# Patient Record
Sex: Female | Born: 1970 | Race: Asian | Hispanic: No | Marital: Married | Smoking: Never smoker
Health system: Southern US, Community
[De-identification: ages and names within clinical notes are randomized; demographics above are authoritative.]

## PROBLEM LIST (undated history)

## (undated) DIAGNOSIS — R569 Unspecified convulsions: Secondary | ICD-10-CM

---

## 1999-10-15 ENCOUNTER — Other Ambulatory Visit: Admission: RE | Admit: 1999-10-15 | Discharge: 1999-10-15 | Payer: Self-pay | Admitting: Obstetrics and Gynecology

## 2000-05-11 ENCOUNTER — Inpatient Hospital Stay (HOSPITAL_COMMUNITY): Admission: AD | Admit: 2000-05-11 | Discharge: 2000-05-14 | Payer: Self-pay | Admitting: Obstetrics and Gynecology

## 2000-05-12 ENCOUNTER — Encounter: Payer: Self-pay | Admitting: Obstetrics and Gynecology

## 2000-06-04 ENCOUNTER — Inpatient Hospital Stay (HOSPITAL_COMMUNITY): Admission: AD | Admit: 2000-06-04 | Discharge: 2000-06-06 | Payer: Self-pay | Admitting: *Deleted

## 2000-07-23 ENCOUNTER — Other Ambulatory Visit: Admission: RE | Admit: 2000-07-23 | Discharge: 2000-07-23 | Payer: Self-pay | Admitting: Obstetrics and Gynecology

## 2002-04-22 ENCOUNTER — Other Ambulatory Visit: Admission: RE | Admit: 2002-04-22 | Discharge: 2002-04-22 | Payer: Self-pay | Admitting: Obstetrics and Gynecology

## 2004-05-07 ENCOUNTER — Emergency Department (HOSPITAL_COMMUNITY): Admission: EM | Admit: 2004-05-07 | Discharge: 2004-05-07 | Payer: Self-pay

## 2010-09-20 ENCOUNTER — Emergency Department (HOSPITAL_COMMUNITY): Payer: BC Managed Care – PPO

## 2010-09-20 ENCOUNTER — Encounter (HOSPITAL_COMMUNITY): Payer: Self-pay | Admitting: Radiology

## 2010-09-20 ENCOUNTER — Observation Stay (HOSPITAL_COMMUNITY)
Admission: EM | Admit: 2010-09-20 | Discharge: 2010-09-21 | Disposition: A | Discharge status: Home / Self Care | Payer: BC Managed Care – PPO | Attending: Internal Medicine | Admitting: Internal Medicine

## 2010-09-20 DIAGNOSIS — E876 Hypokalemia: Secondary | ICD-10-CM | POA: Insufficient documentation

## 2010-09-20 DIAGNOSIS — IMO0002 Reserved for concepts with insufficient information to code with codable children: Secondary | ICD-10-CM | POA: Insufficient documentation

## 2010-09-20 DIAGNOSIS — W19XXXA Unspecified fall, initial encounter: Secondary | ICD-10-CM | POA: Insufficient documentation

## 2010-09-20 DIAGNOSIS — Y9289 Other specified places as the place of occurrence of the external cause: Secondary | ICD-10-CM | POA: Insufficient documentation

## 2010-09-20 DIAGNOSIS — G40909 Epilepsy, unspecified, not intractable, without status epilepticus: Principal | ICD-10-CM | POA: Insufficient documentation

## 2010-09-20 DIAGNOSIS — D72829 Elevated white blood cell count, unspecified: Secondary | ICD-10-CM | POA: Insufficient documentation

## 2010-09-20 DIAGNOSIS — R42 Dizziness and giddiness: Secondary | ICD-10-CM | POA: Insufficient documentation

## 2010-09-20 DIAGNOSIS — R000 Tachycardia, unspecified: Secondary | ICD-10-CM | POA: Insufficient documentation

## 2010-09-20 HISTORY — DX: Unspecified convulsions: R56.9

## 2010-09-20 LAB — URINALYSIS, ROUTINE W REFLEX MICROSCOPIC
Ketones, ur: NEGATIVE mg/dL
Leukocytes, UA: NEGATIVE
Nitrite: NEGATIVE
Protein, ur: NEGATIVE mg/dL
Urine Glucose, Fasting: NEGATIVE mg/dL
Urobilinogen, UA: 0.2 mg/dL (ref 0.0–1.0)

## 2010-09-20 LAB — POCT CARDIAC MARKERS
CKMB, poc: <1 ng/mL — ABNORMAL LOW (ref 1.0–8.0)
Myoglobin, poc: 243 ng/mL (ref 12–200)
Troponin i, poc: <0.05 ng/mL (ref 0.00–0.09)

## 2010-09-20 LAB — CARDIAC PANEL(CRET KIN+CKTOT+MB+TROPI)
Total CK: 571 U/L — ABNORMAL HIGH (ref 7–177)
Troponin I: 0.04 ng/mL (ref 0.00–0.06)

## 2010-09-20 LAB — POCT I-STAT, CHEM 8
BUN: 9 mg/dL (ref 6–23)
Calcium, Ion: 1.14 mmol/L (ref 1.12–1.32)
Chloride: 110 mEq/L (ref 96–112)
Creatinine, Ser: 0.5 mg/dL (ref 0.4–1.2)
Glucose, Bld: 124 mg/dL — ABNORMAL HIGH (ref 70–99)
HCT: 48 % — ABNORMAL HIGH (ref 36.0–46.0)
Hemoglobin: 16.3 g/dL — ABNORMAL HIGH (ref 12.0–15.0)
Potassium: 3.4 mEq/L — ABNORMAL LOW (ref 3.5–5.1)
Sodium: 142 mEq/L (ref 135–145)
TCO2: 11 mmol/L (ref 0–100)

## 2010-09-20 LAB — DIFFERENTIAL
Basophils Absolute: 0 10*3/uL (ref 0.0–0.1)
Eosinophils Absolute: 0.1 10*3/uL (ref 0.0–0.7)
Lymphocytes Relative: 20 % (ref 12–46)
Lymphs Abs: 3.2 10*3/uL (ref 0.7–4.0)
Neutrophils Relative %: 75 % (ref 43–77)

## 2010-09-20 LAB — RAPID URINE DRUG SCREEN, HOSP PERFORMED
Amphetamines: NOT DETECTED
Benzodiazepines: NOT DETECTED
Cocaine: NOT DETECTED
Tetrahydrocannabinol: NOT DETECTED

## 2010-09-20 LAB — URINE MICROSCOPIC-ADD ON

## 2010-09-20 LAB — POCT PREGNANCY, URINE: Preg Test, Ur: NEGATIVE

## 2010-09-20 LAB — CBC
MCV: 90.9 fL (ref 78.0–100.0)
Platelets: 269 10*3/uL (ref 150–400)
RBC: 4.73 MIL/uL (ref 3.87–5.11)
WBC: 16.5 10*3/uL — ABNORMAL HIGH (ref 4.0–10.5)

## 2010-09-20 MED ORDER — GADOBENATE DIMEGLUMINE 529 MG/ML IV SOLN
15.0000 mL | Freq: Once | INTRAVENOUS | Status: AC
  Administered 2010-09-20: 15 mL via INTRAVENOUS

## 2010-09-21 ENCOUNTER — Inpatient Hospital Stay (HOSPITAL_COMMUNITY): Payer: BC Managed Care – PPO

## 2010-09-21 LAB — CBC
HCT: 35 % — ABNORMAL LOW (ref 36.0–46.0)
MCH: 28.2 pg (ref 26.0–34.0)
MCHC: 32.3 g/dL (ref 30.0–36.0)
MCV: 87.3 fL (ref 78.0–100.0)
Platelets: 215 10*3/uL (ref 150–400)
RDW: 12.8 % (ref 11.5–15.5)

## 2010-09-21 LAB — GLUCOSE, CAPILLARY: Glucose-Capillary: 121 mg/dL — ABNORMAL HIGH (ref 70–99)

## 2010-09-21 LAB — BASIC METABOLIC PANEL
BUN: 7 mg/dL (ref 6–23)
Creatinine, Ser: 0.69 mg/dL (ref 0.4–1.2)
GFR calc non Af Amer: >60 mL/min (ref >60)
Glucose, Bld: 99 mg/dL (ref 70–99)

## 2010-10-18 NOTE — H&P (Signed)
NAME:  Lori [name], Lori [name]               ACCOUNT NO.:  [id number]  MEDICAL RECORD NO.:  [id number]           PATIENT TYPE:  I  LOCATION:  3032                         FACILITY:  [facility]  PHYSICIAN:  Kela Millin, M.D.DATE OF BIRTH:  19-Jul-1971  DATE OF ADMISSION:  09/20/2010 DATE OF DISCHARGE:                             HISTORY & PHYSICAL   PRIMARY CARE PHYSICIAN:  The patient is unable.  CHIEF COMPLAINT:  Seizures.  HISTORY OF PRESENT ILLNESS:  The patient is a 40 year old female with past medical history significant for seizure x1 in 2005, for which, according to her husband, she was evaluated in the hospital but never started on anticonvulsants (per ED records, the patient was supposed to follow up with Neurology), and she presents today with the above complaints.  The history is obtained from the ED records as well as the patient's husband and some contribution from the patient.  The patient is an employee of the [employer] and it is reported that she was at work today and per her colleague complained of some dizziness and a few seconds later, she collapsed/fell to the floor, hitting her head and then had a seizure-like activity/convulsion.  Her husband states that he was told that the patient had two of this episode of witnessed seizures at work today.  It is reported that following these episodes, the patient was agitated and confused.  At this time, the patient is unable to tell me whether she was incontinent of stool or urine, and her husband also states that she does not know as she was not present at the time.  She denies chest pain, palpitations, headaches, fevers, dysuria, diarrhea, melena, and no hematochezia.  Her husband states that before today the only seizure that she had had in the past is the one in 2005, but he states that it seemed to happen when patient is tired and has not had enough sleep. She was seen in the ED and a CT scan of her brain was  done, and it showed no evidence of intracranial abnormality, mild left forehead soft tissue swelling without fracture.  A urinalysis was done and came back negative for infection and her white cell count was noted to be 16.5, her alcohol level less than 5, and a chest x-ray showed no acute cardiopulmonary process.  Neurology was consulted to see the patient and Dr. Vickey Huger saw the patient, and she was loaded with 1 g of IV Keppra and started on Keppra 500 q.12 h.  Admission to the hospitalist service was recommended for further evaluation and management.  A urine drug screen was also done in the ED, and it came back negative. At the time of my exam, the patient is alert, oriented x1 with some confusion and has difficulty recalling the events.  PAST MEDICAL HISTORY:  As above.  MEDICATIONS:  Multivitamins daily.  ALLERGIES:  No known drug allergies  SOCIAL HISTORY:  She denies tobacco, also denies alcohol and no illicit drug use.  FAMILY HISTORY:  Per her husband, no family history of seizures or cancer.  REVIEW OF SYSTEMS:  As per HPI, otherwise  unremarkable.  PHYSICAL EXAMINATION:  GENERAL:  The patient is a young female, in no apparent distress.  She is alert, oriented x1. VITAL SIGNS:  Her blood pressure 100/67; temperature 98.3; pulse 87, initially 141 in the ED; respiratory rate 18, initially 28 in the ED; O2 sat 100%. HEENT:  PERRL, EOMI, sclerae anicteric, slightly dry mucous membranes. NECK:  Supple, no adenopathy, no thyromegaly, and no JVD. LUNGS:  Clear to auscultation bilaterally.  No crackles or wheezes. CARDIOVASCULAR:  Regular rate and rhythm.  Normal S1 and S2. ABDOMEN:  Soft, bowel sounds present, nontender, nondistended.  No organomegaly and no masses palpable. EXTREMITIES:  No cyanosis and no edema. NEUROLOGIC:  She is alert and oriented x1.  Cranial nerves II through XII grossly intact.  Her strength is 4-5/5 and symmetric.  DTRs are +2 and symmetric.  He  toes are downgoing bilaterally.  No pronator drift.  LABORATORY DATA:  CT scan and chest x-ray as per HPI.  Urinalysis negative for infection. White cell count is 16.5 with a hemoglobin of 13.7, hematocrit of 43, platelet count of 269.  Alcohol level is less than 5 and her sodium is 142 with a potassium of 3.4, chloride is 110, BUN 9, creatinine is 0.5, glucose is 124, ionized calcium is 1.14. The urine drug screen is negative.  ASSESSMENT AND PLAN: 1. Seizures - witnessed by coworkers.  In hospital, Neurology     consulted and as already discussed above, the patient was started     on Keppra 1 g IV load and 500 q.12 h.  An EEG as well as an MRI/MRA     have been ordered.  Given reports of dizziness prior to the fall     and seizure, I will obtain cardiac enzymes as well as a 2-D     echocardiogram. 2. Tachycardia - upon arrival in the ED, she was noted to have a pulse     of 141 and EKG showed normal sinus tachycardia with a heart rate of     131.  It is noted that the patient was started on IV fluids in the     ED and with that, her tachycardia resolved and on recheck, her     pulse was 87, the patient's blood pressure also reached a low of     95/61 in the ED.  The likely etiology is volume depletion, cardiac     enzymes obtained as above as well, the patient is chest pain free.     We will continue hydration and follow at this time. 3. Leukocytosis - urinalysis and chest x-ray are negative for     infection, we will follow and recheck CBC in a.m. - possibly     secondary to stress demargination. 4. Hypokalemia - replace potassium.     Kela Millin, M.D.     ACV/MEDQ  D:  09/20/2010  T:  09/21/2010  Job:  [id number]  Electronically Signed by Donnalee Curry M.D. on 10/16/2010 07:32:31 PM

## 2010-11-08 NOTE — Consult Note (Signed)
Lori [name], Lori [name]               ACCOUNT NO.:  [id number]  MEDICAL RECORD NO.:  [id number]           PATIENT TYPE:  I  LOCATION:  3032                         FACILITY:  [facility]  PHYSICIAN:  Lori [name], M.D.  DATE OF BIRTH:  August 04, 1971  DATE OF CONSULTATION:  09/20/2010 DATE OF DISCHARGE:                                CONSULTATION   TIME:  1 p.m.  REASON FOR CONSULTATION:  Seizure.  HISTORY OF PRESENT ILLNESSES:  This is a 40 year old female with no past medical history who presented today with 3+ seizures.  Per the patient's husband who is at bedside over the last few months, the patient has been extremely tired and intermittently having episodes and which were very syncopal/seizure like.  Most often, the patient would be standing up when these occurred, suddenly dropped to the floor without any warning with mild jerking.  Most of these events only lasted for a few minutes and had a very short postictal period of 5-10 minutes.  As stated above, the patient's family notices this only occurs when she is extremely tired.  The patient awoke this morning at baseline, went to work at 6:30 a.m.  The patient works as a Forensic psychologist at [employer]. While the patient was at work, she did not feel well and had a sudden episode in which she blacked out, hit the floor, and had notable tonic- clonic activity.  This activity only lasted for approximately 1-2 minutes with a 5-minute postictal period.  The patient was brought to the emergency department where she had a CT of her head which was negative for any mass or bleed.  While in the CT scanner, the patient had a second episode of 1-2 minute seizure activity with again a quick postictal period.  During that time, the patient did have incontinence and did bite the vermilion border of her tongue.  While the patient continued to stay in the emergency room, the patient was able to get up and walked to the bathroom.   Apparently, she requested that she be left alone in the bathroom for privacy.  At that time, the nurse screamed and found the patient on the floor again with tonic-clonic activity.  Again, these activities only lasted for a brief period of time.  The patient's postictal period on the third episode was approximately 10 minutes.  At present time, the patient is lying supine in her bed with no tonic- clonic activity.  She is alert and oriented.  She does have significant bruising and lacerations to the frontal aspect and left periorbital region of her eye.  MEDICATIONS:  None.  ALLERGIES:  None.  FAMILY HISTORY:  None.  SOCIAL HISTORY:  The patient does not drink, smoke, or do illicit drugs. Lives with her husband, has 2 children.  Gets about 6 hours of sleep at night, however, in the last few nights, has received less than that.  REVIEW OF SYSTEMS:  Generalized weakness and tired.  Otherwise, negative.  PHYSICAL EXAMINATION:  VITAL SIGNS:  Blood pressure is 122/61, pulse 102- 141, respirations 28, temperature 98.3. GENERAL:  The patient is alert and  oriented x3.  Carries out 2-3-step commands without difficulty. HEENT:  Pupils equal, round, and reactive to light and accommodation. Conjugate gaze.  Extraocular movements intact.  Visual fields grossly intact.  Face symmetrical.  Tongue is midline.  Uvula is midline.  The patient does show facial laceration and significant bruising to the forehead and to the lateral aspect of the face from falling. NEUROLOGIC:  Facial sensation V1-V3 is intact.  Shoulder shrug and head turn within normal limits.  Speech is clear.  Coordination, finger-to- nose, and heel-to-shin are smooth.  The patient's motor is 5/5 throughout.  Deep tendon reflexes 2+ throughout.  Downgoing toes.  The patient shows no drift in the upper or lower extremities.  Sensation is grossly intact to pinprick, light touch, and vibration. PULMONARY:  Clear to auscultation  bilaterally. CARDIOVASCULAR:  S1 and S2, regular rate and rhythm. NECK:  Negative bruits and supple.  LABS:  UA is pending.  Sodium 142, potassium 3.4, chloride 110, BUN 9, creatinine 0.5, glucose 124.  White blood cell count 16.5, hemoglobin 13.7, hematocrit 43.0, platelets 269.  IMAGING:  CT of head shows no acute hemorrhage, acute infarct or mass lesion is seen.  Chest x-ray, heart size is normal.  Lungs are clear. Cardiac leads overlie the chest, but no pleural effusion or abnormalities.  ASSESSMENT:  This is a 40 year old female with new seizure activity x3 today.  Etiology at this point in time is known.  The patient has no past medical history of seizure but per family members, has had episodes like this over the past few months if not longer.  The patient did have urinary incontinence, tongue biting, and tonic-clonic activity, however, she also showed a very short postictal period.  RECOMMENDATIONS:  Keppra 1 g IV now, then 500 mg IV b.i.d.  Obtain EEG tomorrow.  Obtain MRI with and without contrast to look for any intracranial pathology.  We will continue to follow this patient.     Felicie Morn, PA-C   ______________________________ Lori [name], M.D.    DS/MEDQ  D:  09/20/2010  T:  09/21/2010  Job:  [id number]  Electronically Signed by Felicie Morn PA-C on 09/27/2010 12:51:24 PM Electronically Signed by Lori [name] M.D. on 11/08/2010 12:56:29 PM

## 2010-12-08 ENCOUNTER — Emergency Department (HOSPITAL_COMMUNITY): Payer: BC Managed Care – PPO

## 2010-12-08 ENCOUNTER — Emergency Department (HOSPITAL_COMMUNITY)
Admission: EM | Admit: 2010-12-08 | Discharge: 2010-12-08 | Disposition: A | Discharge status: Home / Self Care | Payer: BC Managed Care – PPO | Attending: Emergency Medicine | Admitting: Emergency Medicine

## 2010-12-08 DIAGNOSIS — S43016A Anterior dislocation of unspecified humerus, initial encounter: Secondary | ICD-10-CM | POA: Insufficient documentation

## 2010-12-08 DIAGNOSIS — IMO0001 Reserved for inherently not codable concepts without codable children: Secondary | ICD-10-CM

## 2010-12-08 DIAGNOSIS — G40909 Epilepsy, unspecified, not intractable, without status epilepticus: Secondary | ICD-10-CM | POA: Insufficient documentation

## 2010-12-08 DIAGNOSIS — R296 Repeated falls: Secondary | ICD-10-CM | POA: Insufficient documentation

## 2010-12-08 DIAGNOSIS — Y92009 Unspecified place in unspecified non-institutional (private) residence as the place of occurrence of the external cause: Secondary | ICD-10-CM | POA: Insufficient documentation

## 2013-01-18 ENCOUNTER — Other Ambulatory Visit: Payer: Self-pay | Admitting: Neurology

## 2013-03-11 ENCOUNTER — Encounter: Payer: Self-pay | Admitting: Nurse Practitioner

## 2013-03-19 ENCOUNTER — Ambulatory Visit (INDEPENDENT_AMBULATORY_CARE_PROVIDER_SITE_OTHER): Payer: BC Managed Care – PPO | Admitting: Nurse Practitioner

## 2013-03-19 ENCOUNTER — Encounter: Payer: Self-pay | Admitting: Nurse Practitioner

## 2013-03-19 VITALS — BP 102/61 | HR 70 | Ht 62.0 in | Wt 130.0 lb

## 2013-03-19 DIAGNOSIS — G40309 Generalized idiopathic epilepsy and epileptic syndromes, not intractable, without status epilepticus: Secondary | ICD-10-CM

## 2013-03-19 DIAGNOSIS — R569 Unspecified convulsions: Secondary | ICD-10-CM | POA: Insufficient documentation

## 2013-03-19 DIAGNOSIS — G40909 Epilepsy, unspecified, not intractable, without status epilepticus: Secondary | ICD-10-CM

## 2013-03-19 MED ORDER — LEVETIRACETAM ER 500 MG PO TB24: ORAL_TABLET | ORAL | Status: DC

## 2013-03-19 NOTE — Patient Instructions (Addendum)
Continue Keppra 1500 XR daily Followup yearly and when necessary Call for any seizure activity

## 2013-03-19 NOTE — Progress Notes (Signed)
I have read the note, and I agree with the clinical assessment and plan.  

## 2013-03-19 NOTE — Progress Notes (Signed)
  Reason for visit followup for generalized seizure disorder  HPI: Lori [name], 42 year old returns for followup. She was last seen 11/27/2011. She has history of 3+ seizures on 09/20/2010, these happened at her work place at [employer]. The seizures were witnessed and there was notable tonic clonic activity, most of them lasted for a few minutes and were followed by a five minute postictal period.During those seizures the patient lost bladder control and bit her tongue. She was brought to the ED where a CT scan was performed, which did not show significant findings. An EEG was performed which showed a marked abnormality with frequent generalized spike slow wave activity, consistent with a generalized epilepsy disorder. An MRI did not show any significant findings, but some scattered subcortical T2 hyperintensities. The patient was put on 500mg  Keppra bid, the patient states that she got really drowsy with this medication.The patient had a  seizure 7 years ago, but was not put on any medication and did not have a seizure again  until the one on Feb.2012.  TODAY: 03/19/13: Last seizure July 2012 and she has been maintained on 1500 mg of Keppra daily. She denies further seizure events since that time. Otherwise the patient is healthy and has not had interval problems. She denies side effects of the medication.   ROS:  Negative   Medications Current Outpatient Prescriptions on File Prior to Visit  Medication Sig Dispense Refill  . cholecalciferol (VITAMIN D) 1000 UNITS tablet Take 1,000 Units by mouth daily.      Marland Kitchen levETIRAcetam (KEPPRA XR) 500 MG 24 hr tablet TAKE 2 TABLETS BY MOUTH EVERY MORNING AND ONE TABLET BY MOUTH EVERY EVENING  90 tablet  2   No current facility-administered medications on file prior to visit.    AllergiesNo Known Allergies  Physical Exam General: well developed, well nourished, seated, in no evident distress Head: head normocephalic and atraumatic. Oropharynx  benign Neck: supple with no carotid  bruits Cardiovascular: regular rate and rhythm, no murmurs  Neurologic Exam Mental Status: Awake and fully alert. Oriented to place and time. Follows all commands. Speech and language normal.   Cranial Nerves:  Pupils equal, briskly reactive to light. Extraocular movements full without nystagmus. Visual fields full to confrontation. Hearing intact and symmetric to finger snap. Facial sensation intact. Face, tongue, palate move normally and symmetrically. Neck flexion and extension normal.  Motor: Normal bulk and tone. Normal strength in all tested extremity muscles.No focal weakness Coordination: Rapid alternating movements normal in all extremities. Finger-to-nose and heel-to-shin performed accurately bilaterally. Gait and Station: Arises from chair without difficulty. Stance is normal. Gait demonstrates normal stride length and balance . Able to heel, toe and tandem walk without difficulty.  Reflexes: 2+ and symmetric. Toes downgoing.     ASSESSMENT: Generalized seizure disorder doing well on Keppra 1500 mg daily XR     PLAN: Will renew medication Call for any seizure activity Followup yearly and when necessary Nilda Riggs, GNP-BC APRN

## 2013-11-03 ENCOUNTER — Ambulatory Visit (INDEPENDENT_AMBULATORY_CARE_PROVIDER_SITE_OTHER): Payer: 59 | Admitting: Physician Assistant

## 2013-11-03 VITALS — BP 122/74 | HR 62 | Temp 98.3°F | Resp 18 | Ht 62.25 in | Wt 137.0 lb

## 2013-11-03 DIAGNOSIS — R059 Cough, unspecified: Secondary | ICD-10-CM

## 2013-11-03 DIAGNOSIS — R05 Cough: Secondary | ICD-10-CM

## 2013-11-03 MED ORDER — BENZONATATE 100 MG PO CAPS: 100.0000 mg | ORAL_CAPSULE | Freq: Three times a day (TID) | ORAL | Status: DC | PRN

## 2013-11-03 MED ORDER — HYDROCODONE-HOMATROPINE 5-1.5 MG/5ML PO SYRP: 5.0000 mL | ORAL_SOLUTION | Freq: Three times a day (TID) | ORAL | Status: DC | PRN

## 2013-11-03 NOTE — Progress Notes (Signed)
   Subjective:    Patient ID: Lori [name], female    DOB: [birth year], 43 y.o.   MRN: [id number]  HPI   Lori [name] is a very pleasant 43 yr old female here with concern for illness.  Reports cough x 6 days.  It is productive of mucus in the morning.  The cough is bothersome at night.  She has been taking Delsym and otc cold and flu medicine for day time.  She also reports her head has felt "heavy" for the last 1-2 days - attributes this to sinus pressure.  No nasal drainage.  No fever.  No SOB or wheezing.  She does have a little sore throat as well.  Fatigued from all the coughing.  No known sick contacts but she is an Garment/textile technologist at [place]  Constitutional: Positive for fatigue. Negative for fever and chills.  HENT: Positive for sinus pressure and sore throat. Negative for congestion, rhinorrhea and sneezing.   Respiratory: Positive for cough. Negative for shortness of breath and wheezing.   Cardiovascular: Negative.   Gastrointestinal: Negative.   Musculoskeletal: Negative.   Skin: Negative.        Objective:   Physical Exam  Vitals reviewed. Constitutional: She is oriented to person, place, and time. She appears well-developed and well-nourished. No distress.  HENT:  Head: Normocephalic and atraumatic.  Right Ear: Tympanic membrane and ear canal normal.  Left Ear: Tympanic membrane and ear canal normal.  Mouth/Throat: Uvula is midline, oropharynx is clear and moist and mucous membranes are normal.  Eyes: Conjunctivae are normal. No scleral icterus.  Neck: Neck supple.  Cardiovascular: Normal rate, regular rhythm and normal heart sounds.   Pulmonary/Chest: Effort normal and breath sounds normal. She has no wheezes. She has no rales.  Abdominal: Soft. There is no tenderness.  Lymphadenopathy:    She has no cervical adenopathy.  Neurological: She is alert and oriented to person, place, and time.  Skin: Skin is warm and dry.  Psychiatric: She has a normal mood  and affect. Her behavior is normal.       Assessment & Plan:  Cough - Plan: benzonatate (TESSALON) 100 MG capsule, HYDROcodone-homatropine (HYCODAN) 5-1.5 MG/5ML syrup   Lori [name] is a very pleasant 43 yr old female here with 6 days of cough.  She is afebrile, lungs CTA, cough is mostly non productive.  Suspect viral etiology.  Discussed this with pt.  Will treat cough with Tessalon and Hycodan.  Discussed the possibility of a short prednisone taper to help calm down cough and relieve sinus pressure but pt prefers to hold off on this.  Can try Mucinex BID.  Push fluids, rest.  Discussed RTC precautions including fever, worsening cough, SOB, etc  Pt to call or RTC if worsening or not improving  E. Natividad Brood [facility], PA-C [facility] 3/25/20159:10 PM

## 2013-11-03 NOTE — Patient Instructions (Signed)
Take the benzonatate (Tessalon Perles) every 8 hours if needed for cough  Use the Hyocdan syrup every 8 hours if needed for cough - may make you sleepy, so try at bed time first.  You can also halve the dose of this if you would like  It is ok to take these two cough medicines together  You can also take plain Mucinex twice daily which will help break up any congestion  Plenty of fluids (water is best!) and rest  Please let me know if any symptoms are worsening or not improving   Upper Respiratory Infection, Adult An upper respiratory infection (URI) is also sometimes known as the common cold. The upper respiratory tract includes the nose, sinuses, throat, trachea, and bronchi. Bronchi are the airways leading to the lungs. Most people improve within 1 week, but symptoms can last up to 2 weeks. A residual cough may last even longer.  CAUSES Many different viruses can infect the tissues lining the upper respiratory tract. The tissues become irritated and inflamed and often become very moist. Mucus production is also common. A cold is contagious. You can easily spread the virus to others by oral contact. This includes kissing, sharing a glass, coughing, or sneezing. Touching your mouth or nose and then touching a surface, which is then touched by another person, can also spread the virus. SYMPTOMS  Symptoms typically develop 1 to 3 days after you come in contact with a cold virus. Symptoms vary from person to person. They may include:  Runny nose.  Sneezing.  Nasal congestion.  Sinus irritation.  Sore throat.  Loss of voice (laryngitis).  Cough.  Fatigue.  Muscle aches.  Loss of appetite.  Headache.  Low-grade fever. DIAGNOSIS  You might diagnose your own cold based on familiar symptoms, since most people get a cold 2 to 3 times a year. Your caregiver can confirm this based on your exam. Most importantly, your caregiver can check that your symptoms are not due to another  disease such as strep throat, sinusitis, pneumonia, asthma, or epiglottitis. Blood tests, throat tests, and X-rays are not necessary to diagnose a common cold, but they may sometimes be helpful in excluding other more serious diseases. Your caregiver will decide if any further tests are required. RISKS AND COMPLICATIONS  You may be at risk for a more severe case of the common cold if you smoke cigarettes, have chronic heart disease (such as heart failure) or lung disease (such as asthma), or if you have a weakened immune system. The very young and very old are also at risk for more serious infections. Bacterial sinusitis, middle ear infections, and bacterial pneumonia can complicate the common cold. The common cold can worsen asthma and chronic obstructive pulmonary disease (COPD). Sometimes, these complications can require emergency medical care and may be life-threatening. PREVENTION  The best way to protect against getting a cold is to practice good hygiene. Avoid oral or hand contact with people with cold symptoms. Wash your hands often if contact occurs. There is no clear evidence that vitamin C, vitamin E, echinacea, or exercise reduces the chance of developing a cold. However, it is always recommended to get plenty of rest and practice good nutrition. TREATMENT  Treatment is directed at relieving symptoms. There is no cure. Antibiotics are not effective, because the infection is caused by a virus, not by bacteria. Treatment may include:  Increased fluid intake. Sports drinks offer valuable electrolytes, sugars, and fluids.  Breathing heated mist or steam (vaporizer  or shower).  Eating chicken soup or other clear broths, and maintaining good nutrition.  Getting plenty of rest.  Using gargles or lozenges for comfort.  Controlling fevers with ibuprofen or acetaminophen as directed by your caregiver.  Increasing usage of your inhaler if you have asthma. Zinc gel and zinc lozenges, taken in  the first 24 hours of the common cold, can shorten the duration and lessen the severity of symptoms. Pain medicines may help with fever, muscle aches, and throat pain. A variety of non-prescription medicines are available to treat congestion and runny nose. Your caregiver can make recommendations and may suggest nasal or lung inhalers for other symptoms.  HOME CARE INSTRUCTIONS   Only take over-the-counter or prescription medicines for pain, discomfort, or fever as directed by your caregiver.  Use a warm mist humidifier or inhale steam from a shower to increase air moisture. This may keep secretions moist and make it easier to breathe.  Drink enough water and fluids to keep your urine clear or pale yellow.  Rest as needed.  Return to work when your temperature has returned to normal or as your caregiver advises. You may need to stay home longer to avoid infecting others. You can also use a face mask and careful hand washing to prevent spread of the virus. SEEK MEDICAL CARE IF:   After the first few days, you feel you are getting worse rather than better.  You need your caregiver's advice about medicines to control symptoms.  You develop chills, worsening shortness of breath, or brown or red sputum. These may be signs of pneumonia.  You develop yellow or brown nasal discharge or pain in the face, especially when you bend forward. These may be signs of sinusitis.  You develop a fever, swollen neck glands, pain with swallowing, or white areas in the back of your throat. These may be signs of strep throat. SEEK IMMEDIATE MEDICAL CARE IF:   You have a fever.  You develop severe or persistent headache, ear pain, sinus pain, or chest pain.  You develop wheezing, a prolonged cough, cough up blood, or have a change in your usual mucus (if you have chronic lung disease).  You develop sore muscles or a stiff neck. Document Released: 01/22/2001 Document Revised: 10/21/2011 Document Reviewed:  11/30/2010 [facility] Patient Information 2014 [place], [place].

## 2013-12-05 ENCOUNTER — Ambulatory Visit (INDEPENDENT_AMBULATORY_CARE_PROVIDER_SITE_OTHER): Payer: 59 | Admitting: Emergency Medicine

## 2013-12-05 ENCOUNTER — Ambulatory Visit: Payer: 59

## 2013-12-05 VITALS — BP 102/64 | HR 68 | Temp 97.9°F | Resp 16 | Ht 61.25 in | Wt 133.0 lb

## 2013-12-05 DIAGNOSIS — J209 Acute bronchitis, unspecified: Secondary | ICD-10-CM

## 2013-12-05 DIAGNOSIS — J018 Other acute sinusitis: Secondary | ICD-10-CM

## 2013-12-05 DIAGNOSIS — R05 Cough: Secondary | ICD-10-CM

## 2013-12-05 DIAGNOSIS — R059 Cough, unspecified: Secondary | ICD-10-CM

## 2013-12-05 MED ORDER — PROMETHAZINE-CODEINE 6.25-10 MG/5ML PO SYRP: 5.0000 mL | ORAL_SOLUTION | Freq: Four times a day (QID) | ORAL | Status: DC | PRN

## 2013-12-05 MED ORDER — PSEUDOEPHEDRINE-GUAIFENESIN ER 60-600 MG PO TB12: 1.0000 | ORAL_TABLET | Freq: Two times a day (BID) | ORAL | Status: DC

## 2013-12-05 MED ORDER — AMOXICILLIN-POT CLAVULANATE 875-125 MG PO TABS: 1.0000 | ORAL_TABLET | Freq: Two times a day (BID) | ORAL | Status: DC

## 2013-12-05 MED ORDER — ALBUTEROL SULFATE HFA 108 (90 BASE) MCG/ACT IN AERS: 2.0000 | INHALATION_SPRAY | RESPIRATORY_TRACT | Status: DC | PRN

## 2013-12-05 NOTE — Progress Notes (Signed)
[facility] [address] [place] [address] [phone]  Date:  12/05/2013   Name:  Lori [name]   DOB:  1971-04-12   MRN:  [id number]  PCP:  No primary provider on file.    Chief Complaint: Sore Throat, Generalized Body Aches and Cough   History of Present Illness:  Lori [name] is a 43 y.o. very pleasant female patient who presents with the following:  Seen a month ago for cough.  Treated for a presumed viral etiology.  Now has a recurrent cough over the past four days with wheezing and pain in the back and a mucopurulent sputum.  Has nasal congestion and post nasal drainage.  No fever or chills.  No history of TB or hemoptysis nor allergies.  Has a sore throat.  No nausea or vomiting.  Never had positive PPD.  No improvement with over the counter medications or other home remedies. Denies other complaint or health concern today.   Patient Active Problem List   Diagnosis Date Noted  . Other convulsions 03/19/2013  . Seizure disorder, primary generalized 03/19/2013    Past Medical History  Diagnosis Date  . Seizure     No past surgical history on file.  History  Substance Use Topics  . Smoking status: Never Smoker   . Smokeless tobacco: Never Used  . Alcohol Use: No    Family History  Problem Relation Age of Onset  . Diabetes Father   . Anemia Father     No Known Allergies  Medication list has been reviewed and updated.  Current Outpatient Prescriptions on File Prior to Visit  Medication Sig Dispense Refill  . cholecalciferol (VITAMIN D) 1000 UNITS tablet Take 1,000 Units by mouth daily.      Marland Kitchen levETIRAcetam (KEPPRA XR) 500 MG 24 hr tablet Take 2 every morning and 1 every evening  90 tablet  11   No current facility-administered medications on file prior to visit.    Review of Systems:  As per HPI, otherwise negative.    Physical Examination: Filed Vitals:   12/05/13 0906  BP: 102/64  Pulse: 68  Temp: 97.9 F (36.6 C)   Resp: 16   Filed Vitals:   12/05/13 0906  Height: 5' 1.25" (1.556 m)  Weight: 133 lb (60.328 kg)   Body mass index is 24.92 kg/(m^2). Ideal Body Weight: Weight in (lb) to have BMI = 25: 133.1  GEN: WDWN, NAD, Non-toxic, A & O x 3 HEENT: Atraumatic, Normocephalic. Neck supple. No masses, No LAD. Ears and Nose: No external deformity. CV: RRR, No M/G/R. No JVD. No thrill. No extra heart sounds. PULM: CTA B, no wheezes, crackles, rhonchi. No retractions. No resp. distress. No accessory muscle use. ABD: S, NT, ND, +BS. No rebound. No HSM. EXTR: No c/c/e NEURO Normal gait.  PSYCH: Normally interactive. Conversant. Not depressed or anxious appearing.  Calm demeanor.    Assessment and Plan: Bronchitis Sinusitis augmentin mucinex  Phen c cod Albuterol   Signed,  Ellison Carwin, MD   [facility] reading (PRIMARY) by  Dr. Ouida Sills  Negative chest.

## 2013-12-05 NOTE — Patient Instructions (Signed)

## 2013-12-21 ENCOUNTER — Encounter: Payer: Self-pay | Admitting: Neurology

## 2014-03-21 ENCOUNTER — Ambulatory Visit: Payer: BC Managed Care – PPO | Admitting: Neurology

## 2014-06-16 ENCOUNTER — Ambulatory Visit (INDEPENDENT_AMBULATORY_CARE_PROVIDER_SITE_OTHER): Payer: 59 | Admitting: Neurology

## 2014-06-16 ENCOUNTER — Encounter: Payer: Self-pay | Admitting: Neurology

## 2014-06-16 VITALS — BP 99/72 | HR 73 | Ht 62.0 in | Wt 134.2 lb

## 2014-06-16 DIAGNOSIS — G40309 Generalized idiopathic epilepsy and epileptic syndromes, not intractable, without status epilepticus: Secondary | ICD-10-CM

## 2014-06-16 MED ORDER — LEVETIRACETAM ER 500 MG PO TB24: ORAL_TABLET | ORAL | Status: DC

## 2014-06-16 NOTE — Progress Notes (Signed)
    Reason for visit: seizures  Lori [name] is an 43 y.o. female  History of present illness:  Lori [name] is a 43 year old right-handed [place] female with a history of seizures. The patient has done quite well over the last year with her seizures. The patient has had no recurrence. She has dropped back from taking 1500 mg daily to 1000 mg daily of the Keppra XR tablet. The patient is tolerating medication well, and she indicates that her last seizure was in 2013. She is operating a motor vehicle without problems. She denies any new medical issues that have come up since last seen.  Past Medical History  Diagnosis Date  . Seizure     History reviewed. No pertinent past surgical history.  Family History  Problem Relation Age of Onset  . Diabetes Father   . Anemia Father   . Seizures Neg Hx     Social history:  reports that she has never smoked. She has never used smokeless tobacco. She reports that she does not drink alcohol or use illicit drugs.   No Known Allergies  Medications:  Current Outpatient Prescriptions on File Prior to Visit  Medication Sig Dispense Refill  . cholecalciferol (VITAMIN D) 1000 UNITS tablet Take 1,000 Units by mouth daily.     No current facility-administered medications on file prior to visit.    ROS:  Out of a complete 14 system review of symptoms, the patient complains only of the following symptoms, and all other reviewed systems are negative.  History of seizures  Blood pressure 99/72, pulse 73, height 5\' 2"  (1.575 m), weight 134 lb 3.2 oz (60.873 kg).  Physical Exam  General: The patient is alert and cooperative at the time of the examination.  Skin: No significant peripheral edema is noted.   Neurologic Exam  Mental status: The patient is oriented x 3.  Cranial nerves: Facial symmetry is present. Speech is normal, no aphasia or dysarthria is noted. Extraocular movements are full. Visual fields are full.  Motor: The patient has  good strength in all 4 extremities.  Sensory examination: Soft touch sensation is symmetric on the face, arms, and legs.  Coordination: The patient has good finger-nose-finger and heel-to-shin bilaterally.  Gait and station: The patient has a normal gait. Tandem gait is normal. Romberg is negative. No drift is seen.  Reflexes: Deep tendon reflexes are symmetric.   Assessment/Plan:  1. History seizures  The patient is doing fairly well with her seizures currently. We will continue the Keppra taking 1000 mg daily. She will follow-up in one year, and she will contact our office if any further issues arise.  Jill Alexanders MD 06/16/2014 8:29 AM  [facility] [address] [place], [place] [address]  Phone [phone] Fax [phone]

## 2014-06-16 NOTE — Patient Instructions (Signed)

## 2014-07-08 ENCOUNTER — Ambulatory Visit: Payer: BC Managed Care – PPO | Admitting: Internal Medicine

## 2014-07-28 ENCOUNTER — Encounter: Payer: Self-pay | Admitting: Internal Medicine

## 2014-07-28 ENCOUNTER — Ambulatory Visit (INDEPENDENT_AMBULATORY_CARE_PROVIDER_SITE_OTHER): Payer: 59 | Admitting: Internal Medicine

## 2014-07-28 VITALS — BP 108/74 | HR 71 | Temp 98.1°F | Resp 14 | Ht 63.0 in | Wt 134.8 lb

## 2014-07-28 DIAGNOSIS — G40309 Generalized idiopathic epilepsy and epileptic syndromes, not intractable, without status epilepticus: Secondary | ICD-10-CM

## 2014-07-28 DIAGNOSIS — Z0000 Encounter for general adult medical examination without abnormal findings: Secondary | ICD-10-CM | POA: Insufficient documentation

## 2014-07-28 NOTE — Patient Instructions (Signed)
We will check your blood work today including your cholesterol and call you back with the results. You can get the blood drawn tomorrow before you eat.  We will see you back next year if you're not having any problems. If you have any new problems please feel free to call our office or come back sooner.  Health Maintenance Adopting a healthy lifestyle and getting preventive care can go a long way to promote health and wellness. Talk with your health care provider about what schedule of regular examinations is right for you. This is a good chance for you to check in with your provider about disease prevention and staying healthy. In between checkups, there are plenty of things you can do on your own. Experts have done a lot of research about which lifestyle changes and preventive measures are most likely to keep you healthy. Ask your health care provider for more information. WEIGHT AND DIET  Eat a healthy diet  Be sure to include plenty of vegetables, fruits, low-fat dairy products, and lean protein.  Do not eat a lot of foods high in solid fats, added sugars, or salt.  Get regular exercise. This is one of the most important things you can do for your health.  Most adults should exercise for at least 150 minutes each week. The exercise should increase your heart rate and make you sweat (moderate-intensity exercise).  Most adults should also do strengthening exercises at least twice a week. This is in addition to the moderate-intensity exercise.  Maintain a healthy weight  Body mass index (BMI) is a measurement that can be used to identify possible weight problems. It estimates body fat based on height and weight. Your health care provider can help determine your BMI and help you achieve or maintain a healthy weight.  For females 32 years of age and older:   A BMI below 18.5 is considered underweight.  A BMI of 18.5 to 24.9 is normal.  A BMI of 25 to 29.9 is considered overweight.  A  BMI of 30 and above is considered obese.  Watch levels of cholesterol and blood lipids  You should start having your blood tested for lipids and cholesterol at 44 years of age, then have this test every 5 years.  You may need to have your cholesterol levels checked more often if:  Your lipid or cholesterol levels are high.  You are older than 44 years of age.  You are at high risk for heart disease.  CANCER SCREENING   Lung Cancer  Lung cancer screening is recommended for adults 21-58 years old who are at high risk for lung cancer because of a history of smoking.  A yearly low-dose CT scan of the lungs is recommended for people who:  Currently smoke.  Have quit within the past 15 years.  Have at least a 30-pack-year history of smoking. A pack year is smoking an average of one pack of cigarettes a day for 1 year.  Yearly screening should continue until it has been 15 years since you quit.  Yearly screening should stop if you develop a health problem that would prevent you from having lung cancer treatment.  Breast Cancer  Practice breast self-awareness. This means understanding how your breasts normally appear and feel.  It also means doing regular breast self-exams. Let your health care provider know about any changes, no matter how small.  If you are in your 20s or 30s, you should have a clinical breast exam (CBE)  by a health care provider every 1-3 years as part of a regular health exam.  If you are 40 or older, have a CBE every year. Also consider having a breast X-ray (mammogram) every year.  If you have a family history of breast cancer, talk to your health care provider about genetic screening.  If you are at high risk for breast cancer, talk to your health care provider about having an MRI and a mammogram every year.  Breast cancer gene (BRCA) assessment is recommended for women who have family members with BRCA-related cancers. BRCA-related cancers  include:  Breast.  Ovarian.  Tubal.  Peritoneal cancers.  Results of the assessment will determine the need for genetic counseling and BRCA1 and BRCA2 testing. Cervical Cancer Routine pelvic examinations to screen for cervical cancer are no longer recommended for nonpregnant women who are considered low risk for cancer of the pelvic organs (ovaries, uterus, and vagina) and who do not have symptoms. A pelvic examination may be necessary if you have symptoms including those associated with pelvic infections. Ask your health care provider if a screening pelvic exam is right for you.   The Pap test is the screening test for cervical cancer for women who are considered at risk.  If you had a hysterectomy for a problem that was not cancer or a condition that could lead to cancer, then you no longer need Pap tests.  If you are older than 65 years, and you have had normal Pap tests for the past 10 years, you no longer need to have Pap tests.  If you have had past treatment for cervical cancer or a condition that could lead to cancer, you need Pap tests and screening for cancer for at least 20 years after your treatment.  If you no longer get a Pap test, assess your risk factors if they change (such as having a new sexual partner). This can affect whether you should start being screened again.  Some women have medical problems that increase their chance of getting cervical cancer. If this is the case for you, your health care provider may recommend more frequent screening and Pap tests.  The human papillomavirus (HPV) test is another test that may be used for cervical cancer screening. The HPV test looks for the virus that can cause cell changes in the cervix. The cells collected during the Pap test can be tested for HPV.  The HPV test can be used to screen women 30 years of age and older. Getting tested for HPV can extend the interval between normal Pap tests from three to five years.  An HPV  test also should be used to screen women of any age who have unclear Pap test results.  After 44 years of age, women should have HPV testing as often as Pap tests.  Colorectal Cancer  This type of cancer can be detected and often prevented.  Routine colorectal cancer screening usually begins at 44 years of age and continues through 44 years of age.  Your health care provider may recommend screening at an earlier age if you have risk factors for colon cancer.  Your health care provider may also recommend using home test kits to check for hidden blood in the stool.  A small camera at the end of a tube can be used to examine your colon directly (sigmoidoscopy or colonoscopy). This is done to check for the earliest forms of colorectal cancer.  Routine screening usually begins at age 50.  Direct   examination of the colon should be repeated every 5-10 years through 44 years of age. However, you may need to be screened more often if early forms of precancerous polyps or small growths are found. Skin Cancer  Check your skin from head to toe regularly.  Tell your health care provider about any new moles or changes in moles, especially if there is a change in a mole's shape or color.  Also tell your health care provider if you have a mole that is larger than the size of a pencil eraser.  Always use sunscreen. Apply sunscreen liberally and repeatedly throughout the day.  Protect yourself by wearing long sleeves, pants, a wide-brimmed hat, and sunglasses whenever you are outside. HEART DISEASE, DIABETES, AND HIGH BLOOD PRESSURE   Have your blood pressure checked at least every 1-2 years. High blood pressure causes heart disease and increases the risk of stroke.  If you are between 55 years and 79 years old, ask your health care provider if you should take aspirin to prevent strokes.  Have regular diabetes screenings. This involves taking a blood sample to check your fasting blood sugar  level.  If you are at a normal weight and have a low risk for diabetes, have this test once every three years after 45 years of age.  If you are overweight and have a high risk for diabetes, consider being tested at a younger age or more often. PREVENTING INFECTION  Hepatitis B  If you have a higher risk for hepatitis B, you should be screened for this virus. You are considered at high risk for hepatitis B if:  You were born in a country where hepatitis B is common. Ask your health care provider which countries are considered high risk.  Your parents were born in a high-risk country, and you have not been immunized against hepatitis B (hepatitis B vaccine).  You have HIV or AIDS.  You use needles to inject street drugs.  You live with someone who has hepatitis B.  You have had sex with someone who has hepatitis B.  You get hemodialysis treatment.  You take certain medicines for conditions, including cancer, organ transplantation, and autoimmune conditions. Hepatitis C  Blood testing is recommended for:  Everyone born from 1945 through 1965.  Anyone with known risk factors for hepatitis C. Sexually transmitted infections (STIs)  You should be screened for sexually transmitted infections (STIs) including gonorrhea and chlamydia if:  You are sexually active and are younger than 44 years of age.  You are older than 44 years of age and your health care provider tells you that you are at risk for this type of infection.  Your sexual activity has changed since you were last screened and you are at an increased risk for chlamydia or gonorrhea. Ask your health care provider if you are at risk.  If you do not have HIV, but are at risk, it may be recommended that you take a prescription medicine daily to prevent HIV infection. This is called pre-exposure prophylaxis (PrEP). You are considered at risk if:  You are sexually active and do not regularly use condoms or know the HIV status  of your partner(s).  You take drugs by injection.  You are sexually active with a partner who has HIV. Talk with your health care provider about whether you are at high risk of being infected with HIV. If you choose to begin PrEP, you should first be tested for HIV. You should then be tested   every 3 months for as long as you are taking PrEP.  PREGNANCY   If you are premenopausal and you may become pregnant, ask your health care provider about preconception counseling.  If you may become pregnant, take 400 to 800 micrograms (mcg) of folic acid every day.  If you want to prevent pregnancy, talk to your health care provider about birth control (contraception). OSTEOPOROSIS AND MENOPAUSE   Osteoporosis is a disease in which the bones lose minerals and strength with aging. This can result in serious bone fractures. Your risk for osteoporosis can be identified using a bone density scan.  If you are 65 years of age or older, or if you are at risk for osteoporosis and fractures, ask your health care provider if you should be screened.  Ask your health care provider whether you should take a calcium or vitamin D supplement to lower your risk for osteoporosis.  Menopause may have certain physical symptoms and risks.  Hormone replacement therapy may reduce some of these symptoms and risks. Talk to your health care provider about whether hormone replacement therapy is right for you.  HOME CARE INSTRUCTIONS   Schedule regular health, dental, and eye exams.  Stay current with your immunizations.   Do not use any tobacco products including cigarettes, chewing tobacco, or electronic cigarettes.  If you are pregnant, do not drink alcohol.  If you are breastfeeding, limit how much and how often you drink alcohol.  Limit alcohol intake to no more than 1 drink per day for nonpregnant women. One drink equals 12 ounces of beer, 5 ounces of wine, or 1 ounces of hard liquor.  Do not use street  drugs.  Do not share needles.  Ask your health care provider for help if you need support or information about quitting drugs.  Tell your health care provider if you often feel depressed.  Tell your health care provider if you have ever been abused or do not feel safe at home. Document Released: 02/11/2011 Document Revised: 12/13/2013 Document Reviewed: 06/30/2013 Exit[employer] Patient Information 2015 [employer]. This information is not intended to replace advice given to you by your health care provider. Make sure you discuss any questions you have with your health care provider.  

## 2014-07-28 NOTE — Assessment & Plan Note (Signed)
Patient is doing well on Keppra 1000 mg daily and has been seizure-free for 2 years. She states she only had one episode of seizure in the past. No side effects from Keppra, check CMP. Check lipid panel.

## 2014-07-28 NOTE — Assessment & Plan Note (Signed)
Patient has flu shot already, will come for tetanus shot after her vacation. IUD for birth control. Pap smear done this year, up-to-date on mammogram. No indication for early colon cancer screening.

## 2014-07-28 NOTE — Progress Notes (Signed)
Pre visit review using our clinic review tool, if applicable. No additional management support is needed unless otherwise documented below in the visit note. 

## 2014-07-28 NOTE — Progress Notes (Signed)
   Subjective:    Patient ID: Lori [name], female    DOB: January 29, 1971, 43 y.o.   MRN: [id number]  HPI The patient is a 43 year old female comes in today to establish care. She has a past history of seizure disorder, she had one seizure and has been on Keppra ever since. She's not had any recurrent seizures in the last 2 years. She does follow-up with neurology. She denies any other complaints including joint pain, chest pain, breathing problems, abdominal pains. She has noticed that she's gained a couple pounds and she isn't sure if this is due to a change in her employment which is caused her to have less time for exercise. She states her thyroid was checked at her gynecologist office and was normal within the last year.  Review of Systems  Constitutional: Negative for fever, activity change, appetite change, fatigue and unexpected weight change.  HENT: Negative.   Respiratory: Negative for cough, chest tightness, shortness of breath and wheezing.   Cardiovascular: Negative for chest pain, palpitations and leg swelling.  Gastrointestinal: Negative for nausea, abdominal pain, diarrhea, constipation and abdominal distention.  Endocrine: Negative for cold intolerance, heat intolerance and polydipsia.  Musculoskeletal: Negative for myalgias, back pain, arthralgias and gait problem.  Skin: Negative.   Neurological: Negative for dizziness, seizures, syncope, weakness, light-headedness, numbness and headaches.  Psychiatric/Behavioral: Negative.       Objective:   Physical Exam  Constitutional: She is oriented to person, place, and time. She appears well-developed and well-nourished.  HENT:  Head: Normocephalic and atraumatic.  Eyes: EOM are normal.  Neck: Normal range of motion. Neck supple.  Cardiovascular: Normal rate and regular rhythm.   Pulmonary/Chest: Effort normal and breath sounds normal. No respiratory distress. She has no wheezes. She has no rales.  Abdominal: Soft. Bowel sounds  are normal. She exhibits no distension. There is no tenderness. There is no rebound.  Musculoskeletal: She exhibits no edema.  Neurological: She is alert and oriented to person, place, and time. Coordination normal.  Skin: Skin is warm and dry.   Filed Vitals:   07/28/14 1333  BP: 108/74  Pulse: 71  Temp: 98.1 F (36.7 C)  TempSrc: Oral  Resp: 14  Height: 5\' 3"  (1.6 m)  Weight: 134 lb 12.8 oz (61.145 kg)  SpO2: 98%      Assessment & Plan:

## 2014-07-29 ENCOUNTER — Other Ambulatory Visit (INDEPENDENT_AMBULATORY_CARE_PROVIDER_SITE_OTHER): Payer: 59

## 2014-07-29 DIAGNOSIS — Z0000 Encounter for general adult medical examination without abnormal findings: Secondary | ICD-10-CM

## 2014-07-29 LAB — COMPREHENSIVE METABOLIC PANEL
ALT: 12 U/L (ref 0–35)
AST: 18 U/L (ref 0–37)
Albumin: 4 g/dL (ref 3.5–5.2)
Alkaline Phosphatase: 53 U/L (ref 39–117)
BILIRUBIN TOTAL: 0.7 mg/dL (ref 0.2–1.2)
BUN: 11 mg/dL (ref 6–23)
CO2: 24 mEq/L (ref 19–32)
Calcium: 9.3 mg/dL (ref 8.4–10.5)
Chloride: 105 mEq/L (ref 96–112)
Creatinine, Ser: 0.7 mg/dL (ref 0.4–1.2)
GFR: 99.86 mL/min (ref >60.00)
GLUCOSE: 97 mg/dL (ref 70–99)
Potassium: 3.8 mEq/L (ref 3.5–5.1)
SODIUM: 138 meq/L (ref 135–145)
Total Protein: 7.3 g/dL (ref 6.0–8.3)

## 2014-07-29 LAB — LIPID PANEL
CHOLESTEROL: 152 mg/dL (ref 0–200)
HDL: 48.1 mg/dL (ref >39.00)
LDL Cholesterol: 95 mg/dL (ref 0–99)
NonHDL: 103.9
TRIGLYCERIDES: 46 mg/dL (ref 0.0–149.0)
Total CHOL/HDL Ratio: 3
VLDL: 9.2 mg/dL (ref 0.0–40.0)

## 2014-09-20 ENCOUNTER — Ambulatory Visit (INDEPENDENT_AMBULATORY_CARE_PROVIDER_SITE_OTHER): Payer: 59 | Admitting: Family

## 2014-09-20 ENCOUNTER — Encounter: Payer: Self-pay | Admitting: Family

## 2014-09-20 VITALS — BP 102/60 | HR 66 | Temp 97.8°F | Resp 18 | Ht 63.0 in | Wt 137.4 lb

## 2014-09-20 DIAGNOSIS — J019 Acute sinusitis, unspecified: Secondary | ICD-10-CM

## 2014-09-20 DIAGNOSIS — R002 Palpitations: Secondary | ICD-10-CM

## 2014-09-20 DIAGNOSIS — J329 Chronic sinusitis, unspecified: Secondary | ICD-10-CM | POA: Insufficient documentation

## 2014-09-20 MED ORDER — AZITHROMYCIN 250 MG PO TABS: ORAL_TABLET | ORAL | Status: DC

## 2014-09-20 NOTE — Progress Notes (Signed)
   Subjective:    Patient ID: Lori [name], female    DOB: 03/09/1971, 44 y.o.   MRN: [id number]  Chief Complaint  Patient presents with  . Nasal Congestion    x1 month, chest palpitations that come and go, headache, weakness, cough, nasal congestion    HPI:  Lori [name] is a 44 y.o. female who presents today for an acute visit.  This is a new problem. Describes a variety of associated symptoms of chest palpitations that come and go, headache, left arm pain, weakness and cough, ear ache, nasal congestion. Has tried ibuprofen and nyquil cold and cough to help with her symptoms but that has not helped greatly. Reports that symptoms are continually waxing and waning with no specific timing. Denies any recent antibiotic use.    No Known Allergies   Current Outpatient Prescriptions on File Prior to Visit  Medication Sig Dispense Refill  . cholecalciferol (VITAMIN D) 1000 UNITS tablet Take 1,000 Units by mouth daily.    Marland Kitchen levETIRAcetam (KEPPRA XR) 500 MG 24 hr tablet Take 2 every morning 60 tablet 11  . Multiple Vitamin (MULTIVITAMIN) tablet Take 1 tablet by mouth daily.     No current facility-administered medications on file prior to visit.   Past Medical History  Diagnosis Date  . Seizure     Review of Systems  Constitutional: Positive for chills. Negative for fever.  HENT: Positive for congestion and sinus pressure.   Respiratory: Positive for cough. Negative for chest tightness, shortness of breath and wheezing.   Cardiovascular: Negative for chest pain, palpitations and leg swelling.  Neurological: Positive for weakness and headaches.      Objective:    BP 102/60 mmHg  Pulse 66  Temp(Src) 97.8 F (36.6 C) (Oral)  Resp 18  Ht 5\' 3"  (1.6 m)  Wt 137 lb 6.4 oz (62.324 kg)  BMI 24.35 kg/m2  SpO2 99% Nursing note and vital signs reviewed.  Physical Exam  Constitutional: She is oriented to person, place, and time. She appears well-developed and well-nourished. No  distress.  HENT:  Right Ear: Hearing, tympanic membrane, external ear and ear canal normal.  Left Ear: Hearing, tympanic membrane, external ear and ear canal normal.  Nose: Right sinus exhibits maxillary sinus tenderness. Right sinus exhibits no frontal sinus tenderness. Left sinus exhibits maxillary sinus tenderness. Left sinus exhibits no frontal sinus tenderness.  Mouth/Throat: Uvula is midline, oropharynx is clear and moist and mucous membranes are normal.  Cardiovascular: Normal rate, regular rhythm, normal heart sounds and intact distal pulses.   Pulmonary/Chest: Effort normal and breath sounds normal.  Neurological: She is alert and oriented to person, place, and time.  Skin: Skin is warm and dry.  Psychiatric: She has a normal mood and affect. Her behavior is normal. Judgment and thought content normal.       Assessment & Plan:

## 2014-09-20 NOTE — Assessment & Plan Note (Signed)
Symptoms and exam consistent with sinusitis. Start azithromycin. Continue over-the-counter medications as needed for symptom relief. Follow-up if symptoms worsen or fail to improve.

## 2014-09-20 NOTE — Patient Instructions (Signed)
Thank you for choosing [facility].  Summary/Instructions:  Your prescription(s) have been submitted to your pharmacy or been printed and provided for you. Please take as directed and contact our office if you believe you are having problem(s) with the medication(s) or have any questions.  If your symptoms worsen or fail to improve, please contact our office for further instruction, or in case of emergency go directly to the emergency room at the closest medical facility.   General Recommendations:    Please drink plenty of fluids.  Get plenty of rest   Sleep in humidified air  Use saline nasal sprays  Netti pot   OTC Medications:  Decongestants - helps relieve congestion   Flonase (generic fluticasone) or Nasacort (generic triamcinolone) - please make sure to use the "cross-over" technique at a 45 degree angle towards the opposite eye as opposed to straight up the nasal passageway.   Sudafed (generic pseudoephedrine - Note this is the one that is available behind the pharmacy counter); Products with phenylephrine (-PE) may also be used but is often not as effective as pseudoephedrine.   If you have HIGH BLOOD PRESSURE - Coricidin HBP; AVOID any product that is -D as this contains pseudoephedrine which may increase your blood pressure.  Afrin (oxymetazoline) every 6-8 hours for up to 3 days.   Allergies - helps relieve runny nose, itchy eyes and sneezing   Claritin (generic loratidine), Allegra (fexofenidine), or Zyrtec (generic cyrterizine) for runny nose. These medications should not cause drowsiness.  Note - Benadryl (generic diphenhydramine) may be used however may cause drowsiness  Cough -   Delsym or Robitussin (generic dextromethorphan)  Expectorants - helps loosen mucus to ease removal   Mucinex (generic guaifenesin) as directed on the package.  Headaches / General Aches   Tylenol (generic acetaminophen) - DO NOT EXCEED 3 grams (3,000 mg) in a 24  hour time period  Advil/Motrin (generic ibuprofen)   Sore Throat -   Salt water gargle   Chloraseptic (generic benzocaine) spray or lozenges / Sucrets (generic dyclonine)      

## 2014-09-20 NOTE — Assessment & Plan Note (Signed)
In office EKG reveals normal sinus rhythm. Continue to monitor palpitations at this time as patient is not currently symptomatic.. If they continue, consider Holter monitoring or referral to cardiology for further workup.

## 2014-09-20 NOTE — Progress Notes (Signed)
Pre visit review using our clinic review tool, if applicable. No additional management support is needed unless otherwise documented below in the visit note. 

## 2014-09-30 ENCOUNTER — Telehealth: Payer: Self-pay

## 2014-09-30 MED ORDER — LEVOFLOXACIN 500 MG PO TABS: 500.0000 mg | ORAL_TABLET | Freq: Every day | ORAL | Status: DC

## 2014-09-30 NOTE — Telephone Encounter (Signed)
Pt called stating that she was given a zpack at the last visit. She said that it did work for a bit but then the symptoms came back worse. Would like something else sent in. Please advise.

## 2014-09-30 NOTE — Telephone Encounter (Signed)
Pt is aware.  

## 2014-09-30 NOTE — Telephone Encounter (Signed)
Please inform patient that a new prescription has been sent to her pharmacy.

## 2014-10-06 ENCOUNTER — Encounter: Payer: Self-pay | Admitting: Internal Medicine

## 2014-10-06 ENCOUNTER — Ambulatory Visit (INDEPENDENT_AMBULATORY_CARE_PROVIDER_SITE_OTHER): Payer: 59 | Admitting: Internal Medicine

## 2014-10-06 VITALS — BP 118/68 | HR 60 | Temp 98.2°F | Ht 63.0 in | Wt 134.1 lb

## 2014-10-06 DIAGNOSIS — K219 Gastro-esophageal reflux disease without esophagitis: Secondary | ICD-10-CM

## 2014-10-06 DIAGNOSIS — R05 Cough: Secondary | ICD-10-CM

## 2014-10-06 DIAGNOSIS — J310 Chronic rhinitis: Secondary | ICD-10-CM

## 2014-10-06 DIAGNOSIS — R059 Cough, unspecified: Secondary | ICD-10-CM

## 2014-10-06 MED ORDER — FLUTICASONE PROPIONATE 50 MCG/ACT NA SUSP: 1.0000 | Freq: Two times a day (BID) | NASAL | Status: DC | PRN

## 2014-10-06 NOTE — Patient Instructions (Addendum)
Plain Mucinex (NOT D) for thick secretions ;force NON dairy fluids .   Nasal cleansing in the shower as discussed with lather of mild shampoo.After 10 seconds wash off lather while  exhaling through nostrils. Make sure that all residual soap is removed to prevent irritation.  Flonase OR Nasacort AQ 1 spray in each nostril twice a day as needed. Use the "crossover" technique into opposite nostril spraying toward opposite ear @ 45 degree angle, not straight up into nostril.  Plain Allegra (NOT D )  160 daily , Loratidine 10 mg , OR Zyrtec 10 mg @ bedtime  as needed for itchy eyes & sneezing.  Reflux of gastric acid may be asymptomatic as this may occur mainly during sleep.The triggers for reflux  include stress; the "aspirin family" ; alcohol; peppermint; and caffeine (coffee, tea, cola, and chocolate). The aspirin family would include aspirin and the nonsteroidal agents such as ibuprofen &  Naproxen. Tylenol would not cause reflux. If having symptoms ; food & drink should be avoided for @ least 2 hours before going to bed.   Symbicort  one - two inhalations every 12 hours; gargle and spit after use ; gargle and spit after use (Lot# [id number]; Exp date 09/2015). Take the protein pump inhibitor,Nexium 20 mg  30 minutes before breakfast and 30 minutes before the evening meal . ( Lot # [id number]; Exp 08/2015)

## 2014-10-06 NOTE — Progress Notes (Signed)
Pre visit review using our clinic review tool, if applicable. No additional management support is needed unless otherwise documented below in the visit note. 

## 2014-10-06 NOTE — Progress Notes (Signed)
   Subjective:    Patient ID: Lori [name], female    DOB: 15-Feb-1971, 44 y.o.   MRN: [id number]  HPI She has completed courses of azithromycin as well as Levaquin but continues to have some cough.  Other symptoms include headache over the crown. She does have some increased burping. Last week she noted wheezing with the cough. She also had some substernal burning with the cough.  She denies other upper respiratory tract infection symptoms or extrinsic symptoms. Additionally there are no other GI symptoms.  She's never smoked. She has no history of asthma  Review of Systems Frontal headache, facial pain , nasal purulence, dental pain, sore throat , otic pain or otic discharge denied. No fever , chills or sweats.    Objective:   Physical Exam   Pertinent or positive findings include: She has mild erythema of the nasopharynx with deviation of the nasal septum to the right.  General appearance:Adequately nourished; no acute distress or increased work of breathing is present.  No  lymphadenopathy about the head, neck, or axilla noted.   Eyes: No conjunctival inflammation or lid edema is present. There is no scleral icterus.  Ears:  External ear exam shows no significant lesions or deformities.  Otoscopic examination reveals clear canals, tympanic membranes are intact bilaterally without bulging, retraction, inflammation or discharge.  Nose:  External nasal examination shows no deformity or inflammation. .   Oral exam: Dental hygiene is good; lips and gums are healthy appearing.There is no oropharyngeal erythema or exudate noted.   Neck:  No deformities, thyromegaly, masses, or tenderness noted.   Supple with full range of motion without pain.   Heart:  Normal rate and regular rhythm. S1 and S2 normal without gallop, murmur, click, rub or other extra sounds.   Lungs:Chest clear to auscultation; no wheezes, rhonchi,rales ,or rubs present.  Extremities:  No cyanosis, edema, or  clubbing  noted    Skin: Warm & dry w/o  tenting.      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  #2 possible GERD component as well.  The pathophysiology of cough related to reflux and rhinitis was explained. The risk of taking more antibiotics with potential for colitis or superimposed fungal infection was discussed.

## 2015-04-25 ENCOUNTER — Other Ambulatory Visit (INDEPENDENT_AMBULATORY_CARE_PROVIDER_SITE_OTHER): Payer: 59

## 2015-04-25 ENCOUNTER — Ambulatory Visit (INDEPENDENT_AMBULATORY_CARE_PROVIDER_SITE_OTHER): Payer: 59 | Admitting: Internal Medicine

## 2015-04-25 ENCOUNTER — Encounter: Payer: Self-pay | Admitting: Internal Medicine

## 2015-04-25 VITALS — BP 100/67 | HR 72 | Temp 98.3°F | Resp 16 | Ht 63.0 in | Wt 133.0 lb

## 2015-04-25 DIAGNOSIS — B9789 Other viral agents as the cause of diseases classified elsewhere: Secondary | ICD-10-CM

## 2015-04-25 DIAGNOSIS — M791 Myalgia, unspecified site: Secondary | ICD-10-CM

## 2015-04-25 DIAGNOSIS — J069 Acute upper respiratory infection, unspecified: Secondary | ICD-10-CM

## 2015-04-25 DIAGNOSIS — Z23 Encounter for immunization: Secondary | ICD-10-CM

## 2015-04-25 LAB — CBC WITH DIFFERENTIAL/PLATELET
BASOS ABS: 0 10*3/uL (ref 0.0–0.1)
Basophils Relative: 0.4 % (ref 0.0–3.0)
EOS ABS: 0.1 10*3/uL (ref 0.0–0.7)
Eosinophils Relative: 1.5 % (ref 0.0–5.0)
HEMATOCRIT: 39.1 % (ref 36.0–46.0)
HEMOGLOBIN: 12.9 g/dL (ref 12.0–15.0)
LYMPHS PCT: 28.8 % (ref 12.0–46.0)
Lymphs Abs: 2.2 10*3/uL (ref 0.7–4.0)
MCHC: 32.9 g/dL (ref 30.0–36.0)
MCV: 87.3 fl (ref 78.0–100.0)
MONO ABS: 0.6 10*3/uL (ref 0.1–1.0)
Monocytes Relative: 7.3 % (ref 3.0–12.0)
Neutro Abs: 4.8 10*3/uL (ref 1.4–7.7)
Neutrophils Relative %: 62 % (ref 43.0–77.0)
PLATELETS: 235 10*3/uL (ref 150.0–400.0)
RBC: 4.48 Mil/uL (ref 3.87–5.11)
RDW: 12.4 % (ref 11.5–15.5)
WBC: 7.7 10*3/uL (ref 4.0–10.5)

## 2015-04-25 LAB — URINALYSIS, ROUTINE W REFLEX MICROSCOPIC
BILIRUBIN URINE: NEGATIVE
Hgb urine dipstick: NEGATIVE
Ketones, ur: NEGATIVE
LEUKOCYTES UA: NEGATIVE
Nitrite: NEGATIVE
PH: 5.5 (ref 5.0–8.0)
RBC / HPF: NONE SEEN (ref 0–?)
TOTAL PROTEIN, URINE-UPE24: NEGATIVE
UROBILINOGEN UA: 0.2 (ref 0.0–1.0)
Urine Glucose: NEGATIVE
WBC, UA: NONE SEEN (ref 0–?)

## 2015-04-25 LAB — SEDIMENTATION RATE: SED RATE: 32 mm/h — AB (ref 0–22)

## 2015-04-25 LAB — COMPREHENSIVE METABOLIC PANEL
ALBUMIN: 4.2 g/dL (ref 3.5–5.2)
ALT: 11 U/L (ref 0–35)
AST: 15 U/L (ref 0–37)
Alkaline Phosphatase: 57 U/L (ref 39–117)
BILIRUBIN TOTAL: 0.3 mg/dL (ref 0.2–1.2)
BUN: 7 mg/dL (ref 6–23)
CALCIUM: 9.3 mg/dL (ref 8.4–10.5)
CO2: 26 mEq/L (ref 19–32)
CREATININE: 0.58 mg/dL (ref 0.40–1.20)
Chloride: 104 mEq/L (ref 96–112)
GFR: 119.58 mL/min (ref >60.00)
Glucose, Bld: 93 mg/dL (ref 70–99)
Potassium: 3.8 mEq/L (ref 3.5–5.1)
SODIUM: 138 meq/L (ref 135–145)
TOTAL PROTEIN: 7.6 g/dL (ref 6.0–8.3)

## 2015-04-25 LAB — CK: CK TOTAL: 64 U/L (ref 7–177)

## 2015-04-25 LAB — TSH: TSH: 0.05 u[IU]/mL — AB (ref 0.35–4.50)

## 2015-04-25 NOTE — Patient Instructions (Signed)
Upper Respiratory Infection, Adult An upper respiratory infection (URI) is also known as the common cold. It is often caused by a type of germ (virus). Colds are easily spread (contagious). You can pass it to others by kissing, coughing, sneezing, or drinking out of the same glass. Usually, you get better in 1 or 2 weeks.  HOME CARE   Only take medicine as told by your doctor.  Use a warm mist humidifier or breathe in steam from a hot shower.  Drink enough water and fluids to keep your pee (urine) clear or pale yellow.  Get plenty of rest.  Return to work when your temperature is back to normal or as told by your doctor. You may use a face mask and wash your hands to stop your cold from spreading. GET HELP RIGHT AWAY IF:   After the first few days, you feel you are getting worse.  You have questions about your medicine.  You have chills, shortness of breath, or brown or red spit (mucus).  You have yellow or brown snot (nasal discharge) or pain in the face, especially when you bend forward.  You have a fever, puffy (swollen) neck, pain when you swallow, or white spots in the back of your throat.  You have a bad headache, ear pain, sinus pain, or chest pain.  You have a high-pitched whistling sound when you breathe in and out (wheezing).  You have a lasting cough or cough up blood.  You have sore muscles or a stiff neck. MAKE SURE YOU:   Understand these instructions.  Will watch your condition.  Will get help right away if you are not doing well or get worse. Document Released: 01/15/2008 Document Revised: 10/21/2011 Document Reviewed: 11/03/2013 Exit[employer] Patient Information 2015 [employer]. This information is not intended to replace advice given to you by your health care provider. Make sure you discuss any questions you have with your health care provider.  

## 2015-04-25 NOTE — Progress Notes (Signed)
Pre visit review using our clinic review tool, if applicable. No additional management support is needed unless otherwise documented below in the visit note. 

## 2015-04-25 NOTE — Progress Notes (Signed)
Subjective:  Patient ID: Lori [name], female    DOB: 01/08/1971  Age: 44 y.o. MRN: [id number]  CC: URI   HPI Lori [name] presents for a 3 day history of sore throat and myalgias. She had a nonproductive cough at the onset but the cough has resolved. She complains of nasal congestion, postnasal drip and fatigue. She takes Motrin and it provides symptom relief.  Outpatient Prescriptions Prior to Visit  Medication Sig Dispense Refill  . cholecalciferol (VITAMIN D) 1000 UNITS tablet Take 1,000 Units by mouth daily.    Marland Kitchen levETIRAcetam (KEPPRA XR) 500 MG 24 hr tablet Take 2 every morning 60 tablet 11  . Multiple Vitamin (MULTIVITAMIN) tablet Take 1 tablet by mouth daily.    . fluticasone (FLONASE) 50 MCG/ACT nasal spray Place 1 spray into both nostrils 2 (two) times daily as needed for allergies or rhinitis. 16 g 2   No facility-administered medications prior to visit.    ROS Review of Systems  Constitutional: Negative.  Negative for fever, chills, diaphoresis, appetite change and fatigue.  HENT: Positive for congestion, postnasal drip, rhinorrhea and sore throat. Negative for facial swelling, sinus pressure, tinnitus, trouble swallowing and voice change.   Eyes: Negative.   Respiratory: Positive for cough. Negative for apnea, choking, chest tightness, shortness of breath, wheezing and stridor.   Cardiovascular: Negative.  Negative for chest pain, palpitations and leg swelling.  Gastrointestinal: Negative.  Negative for nausea, vomiting, abdominal pain, diarrhea, constipation and blood in stool.  Endocrine: Negative.   Genitourinary: Negative.  Negative for difficulty urinating.  Musculoskeletal: Positive for myalgias and back pain. Negative for joint swelling, arthralgias, gait problem, neck pain and neck stiffness.  Skin: Negative.  Negative for rash.  Allergic/Immunologic: Negative.   Neurological: Negative.   Hematological: Negative.  Negative for adenopathy. Does not  bruise/bleed easily.  Psychiatric/Behavioral: Negative.     Objective:  BP 100/67 mmHg  Pulse 72  Temp(Src) 98.3 F (36.8 C) (Oral)  Resp 16  Ht 5\' 3"  (1.6 m)  Wt 133 lb (60.328 kg)  BMI 23.57 kg/m2  SpO2 97%  BP Readings from Last 3 Encounters:  04/25/15 100/67  10/06/14 118/68  09/20/14 102/60    Wt Readings from Last 3 Encounters:  04/25/15 133 lb (60.328 kg)  10/06/14 134 lb 0.8 oz (60.805 kg)  09/20/14 137 lb 6.4 oz (62.324 kg)    Physical Exam  Constitutional: She is oriented to person, place, and time. She appears well-developed and well-nourished.  Non-toxic appearance. She does not have a sickly appearance. She does not appear ill. No distress.  HENT:  Head: Normocephalic and atraumatic.  Nose: No mucosal edema or rhinorrhea. Right sinus exhibits no maxillary sinus tenderness and no frontal sinus tenderness. Left sinus exhibits no maxillary sinus tenderness and no frontal sinus tenderness.  Mouth/Throat: Oropharynx is clear and moist and mucous membranes are normal. Mucous membranes are not pale, not dry and not cyanotic. No oral lesions. No trismus in the jaw. No uvula swelling. No oropharyngeal exudate, posterior oropharyngeal edema, posterior oropharyngeal erythema or tonsillar abscesses.  Eyes: Conjunctivae are normal. Right eye exhibits no discharge. Left eye exhibits no discharge. No scleral icterus.  Neck: Normal range of motion. Neck supple. No JVD present. No tracheal deviation present. No thyromegaly present.  Cardiovascular: Normal rate, regular rhythm, normal heart sounds and intact distal pulses.  Exam reveals no gallop and no friction rub.   No murmur heard. Pulmonary/Chest: Effort normal and breath sounds normal. No stridor. No  respiratory distress. She has no wheezes. She has no rales. She exhibits no tenderness.  Abdominal: Soft. Bowel sounds are normal. She exhibits no distension and no mass. There is no tenderness. There is no rebound and no guarding.   Musculoskeletal: Normal range of motion. She exhibits no edema or tenderness.  Lymphadenopathy:    She has no cervical adenopathy.  Neurological: She is oriented to person, place, and time.  Skin: Skin is warm and dry. No rash noted. She is not diaphoretic. No erythema. No pallor.  Psychiatric: She has a normal mood and affect. Her behavior is normal. Judgment and thought content normal.  Vitals reviewed.   Lab Results  Component Value Date   WBC 7.7 04/25/2015   HGB 12.9 04/25/2015   HCT 39.1 04/25/2015   PLT 235.0 04/25/2015   GLUCOSE 93 04/25/2015   CHOL 152 07/29/2014   TRIG 46.0 07/29/2014   HDL 48.10 07/29/2014   LDLCALC 95 07/29/2014   ALT 11 04/25/2015   AST 15 04/25/2015   NA 138 04/25/2015   K 3.8 04/25/2015   CL 104 04/25/2015   CREATININE 0.58 04/25/2015   BUN 7 04/25/2015   CO2 26 04/25/2015   TSH 0.05* 04/25/2015    Dg Shoulder 1v Right  12/08/2010   *RADIOLOGY REPORT*  Clinical Data: Shoulder dislocation, post reduction  RIGHT SHOULDER - 1 VIEW  Comparison: 12/08/2010  Findings: Improved alignment of the right shoulder following reduction.  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  Limited single view exam.  IMPRESSION: Improved alignment following shoulder reduction. Hill-Sachs deformity.  Original Report Authenticated By: Jerilynn Mages. Daryll Brod, M.D.  Dg Shoulder Right  12/08/2010   *RADIOLOGY REPORT*  Clinical Data: [place].  Right shoulder pain.  RIGHT SHOULDER - 2+ VIEW  Comparison: None  Findings: There is an anterior dislocation of the right humeral head in a subcoracoid position.  Probable associated greater tuberosity fracture.  IMPRESSION:  1.  Anterior subcoracoid dislocation. 2.  Probable small greater tuberosity fracture.  Original Report Authenticated By: P. Kalman Jewels, M.D.   Assessment & Plan:   Lori [name] was seen today for uri.  Diagnoses and all orders for this visit:  Viral URI- she has signs and symptoms  consistent with a viral upper respiratory infection that is resolving. She does not request any additional symptomatic relief at this time. -     CBC with Differential/Platelet; Future  Need for Tdap vaccination -     Tdap vaccine greater than or equal to 7yo IM  Myalgia- her symptoms are consistent with the upper respiratory infection however her labs show a slightly suppressed TSH. I have asked her to return for further evaluation of her thyroid function studies. -     Comprehensive metabolic panel; Future -     CBC with Differential/Platelet; Future -     CK; Future -     Sedimentation rate; Future -     TSH; Future -     Urinalysis, Routine w reflex microscopic (not at [facility]); Future   I have discontinued Ms. Poust's fluticasone. I am also having her maintain her cholecalciferol, multivitamin, and levETIRAcetam.  No orders of the defined types were placed in this encounter.     Follow-up: Return in about 3 weeks (around 05/16/2015).  Scarlette Calico, MD

## 2015-04-26 ENCOUNTER — Ambulatory Visit: Payer: 59 | Admitting: Internal Medicine

## 2015-04-27 ENCOUNTER — Other Ambulatory Visit (INDEPENDENT_AMBULATORY_CARE_PROVIDER_SITE_OTHER): Payer: 59

## 2015-04-27 ENCOUNTER — Ambulatory Visit (INDEPENDENT_AMBULATORY_CARE_PROVIDER_SITE_OTHER)
Admission: RE | Admit: 2015-04-27 | Discharge: 2015-04-27 | Disposition: A | Discharge status: Home / Self Care | Payer: 59 | Source: Ambulatory Visit | Attending: Internal Medicine | Admitting: Internal Medicine

## 2015-04-27 ENCOUNTER — Ambulatory Visit: Payer: 59 | Admitting: Internal Medicine

## 2015-04-27 ENCOUNTER — Ambulatory Visit (INDEPENDENT_AMBULATORY_CARE_PROVIDER_SITE_OTHER): Payer: 59 | Admitting: Internal Medicine

## 2015-04-27 ENCOUNTER — Encounter: Payer: Self-pay | Admitting: Internal Medicine

## 2015-04-27 VITALS — BP 100/60 | HR 65 | Temp 98.1°F | Resp 16 | Ht 63.0 in | Wt 132.0 lb

## 2015-04-27 DIAGNOSIS — R946 Abnormal results of thyroid function studies: Secondary | ICD-10-CM

## 2015-04-27 DIAGNOSIS — M546 Pain in thoracic spine: Secondary | ICD-10-CM

## 2015-04-27 DIAGNOSIS — M549 Dorsalgia, unspecified: Secondary | ICD-10-CM | POA: Insufficient documentation

## 2015-04-27 DIAGNOSIS — R7989 Other specified abnormal findings of blood chemistry: Secondary | ICD-10-CM

## 2015-04-27 NOTE — Patient Instructions (Signed)
Back Pain, Adult Low back pain is very common. About 1 in 5 people have back pain.The cause of low back pain is rarely dangerous. The pain often gets better over time.About half of people with a sudden onset of back pain feel better in just 2 weeks. About 8 in 10 people feel better by 6 weeks.  CAUSES Some common causes of back pain include:  Strain of the muscles or ligaments supporting the spine.  Wear and tear (degeneration) of the spinal discs.  Arthritis.  Direct injury to the back. DIAGNOSIS Most of the time, the direct cause of low back pain is not known.However, back pain can be treated effectively even when the exact cause of the pain is unknown.Answering your caregiver's questions about your overall health and symptoms is one of the most accurate ways to make sure the cause of your pain is not dangerous. If your caregiver needs more information, he or she may order lab work or imaging tests (X-rays or MRIs).However, even if imaging tests show changes in your back, this usually does not require surgery. HOME CARE INSTRUCTIONS For many people, back pain returns.Since low back pain is rarely dangerous, it is often a condition that people can learn to manageon their own.   Remain active. It is stressful on the back to sit or stand in one place. Do not sit, drive, or stand in one place for more than 30 minutes at a time. Take short walks on level surfaces as soon as pain allows.Try to increase the length of time you walk each day.  Do not stay in bed.Resting more than 1 or 2 days can delay your recovery.  Do not avoid exercise or work.Your body is made to move.It is not dangerous to be active, even though your back may hurt.Your back will likely heal faster if you return to being active before your pain is gone.  Pay attention to your body when you bend and lift. Many people have less discomfortwhen lifting if they bend their knees, keep the load close to their bodies,and  avoid twisting. Often, the most comfortable positions are those that put less stress on your recovering back.  Find a comfortable position to sleep. Use a firm mattress and lie on your side with your knees slightly bent. If you lie on your back, put a pillow under your knees.  Only take over-the-counter or prescription medicines as directed by your caregiver. Over-the-counter medicines to reduce pain and inflammation are often the most helpful.Your caregiver may prescribe muscle relaxant drugs.These medicines help dull your pain so you can more quickly return to your normal activities and healthy exercise.  Put ice on the injured area.  Put ice in a plastic bag.  Place a towel between your skin and the bag.  Leave the ice on for 15-20 minutes, 03-04 times a day for the first 2 to 3 days. After that, ice and heat may be alternated to reduce pain and spasms.  Ask your caregiver about trying back exercises and gentle massage. This may be of some benefit.  Avoid feeling anxious or stressed.Stress increases muscle tension and can worsen back pain.It is important to recognize when you are anxious or stressed and learn ways to manage it.Exercise is a great option. SEEK MEDICAL CARE IF:  You have pain that is not relieved with rest or medicine.  You have pain that does not improve in 1 week.  You have new symptoms.  You are generally not feeling well. SEEK   IMMEDIATE MEDICAL CARE IF:   You have pain that radiates from your back into your legs.  You develop new bowel or bladder control problems.  You have unusual weakness or numbness in your arms or legs.  You develop nausea or vomiting.  You develop abdominal pain.  You feel faint. Document Released: 07/29/2005 Document Revised: 01/28/2012 Document Reviewed: 11/30/2013 [employer] Patient Information 2015 [employer]. This information is not intended to replace advice given to you by your health care provider. Make sure you  discuss any questions you have with your health care provider.  

## 2015-04-27 NOTE — Progress Notes (Signed)
Pre visit review using our clinic review tool, if applicable. No additional management support is needed unless otherwise documented below in the visit note. 

## 2015-04-28 ENCOUNTER — Encounter: Payer: Self-pay | Admitting: Internal Medicine

## 2015-04-28 LAB — T3, FREE: T3 FREE: 4.7 pg/mL — AB (ref 2.3–4.2)

## 2015-04-28 LAB — T4, FREE: FREE T4: 1.4 ng/dL (ref 0.60–1.60)

## 2015-04-28 LAB — T3 UPTAKE: T3 Uptake: 30 % (ref 22–35)

## 2015-04-28 LAB — TSH: TSH: 0.01 u[IU]/mL — AB (ref 0.35–4.50)

## 2015-04-28 LAB — T4: T4 TOTAL: 10.2 ug/dL (ref 4.5–12.0)

## 2015-04-28 LAB — THYROID PEROXIDASE ANTIBODY: THYROID PEROXIDASE ANTIBODY: 3 [IU]/mL (ref <9)

## 2015-04-28 NOTE — Progress Notes (Signed)
Subjective:  Patient ID: Lori [name], female    DOB: [birth year]  Age: 44 y.o. MRN: [id number]  CC: Back Pain   HPI Lori [name] presents for follow-up. She was seen earlier this week for URI symptoms and she tells me those symptoms have resolved. She returned today because her TSH was low. She also mentions today that she has had diffuse pain in her thoracic spine for about 6 months. She describes it as an intermittent achy sensation with certain positions and movements. She treats it with over-the-counter medications.  Outpatient Prescriptions Prior to Visit  Medication Sig Dispense Refill  . cholecalciferol (VITAMIN D) 1000 UNITS tablet Take 1,000 Units by mouth daily.    Marland Kitchen levETIRAcetam (KEPPRA XR) 500 MG 24 hr tablet Take 2 every morning 60 tablet 11  . Multiple Vitamin (MULTIVITAMIN) tablet Take 1 tablet by mouth daily.     No facility-administered medications prior to visit.    ROS Review of Systems  Constitutional: Negative.  Negative for fever, chills, diaphoresis, appetite change and fatigue.  HENT: Negative.   Eyes: Negative.   Respiratory: Negative.  Negative for cough, choking, chest tightness, shortness of breath and stridor.   Cardiovascular: Negative.  Negative for chest pain, palpitations and leg swelling.  Gastrointestinal: Negative.  Negative for nausea, vomiting, abdominal pain, diarrhea, constipation and blood in stool.  Endocrine: Negative.   Genitourinary: Negative.   Musculoskeletal: Negative.  Negative for myalgias, back pain, joint swelling and arthralgias.  Skin: Negative.  Negative for rash.  Allergic/Immunologic: Negative.   Neurological: Negative.  Negative for dizziness, syncope, speech difficulty, light-headedness, numbness and headaches.  Hematological: Negative.  Negative for adenopathy. Does not bruise/bleed easily.  Psychiatric/Behavioral: Negative.     Objective:  BP 100/60 mmHg  Pulse 65  Temp(Src) 98.1 F (36.7 C) (Oral)  Ht 5'  3" (1.6 m)  Wt 132 lb (59.875 kg)  BMI 23.39 kg/m2  SpO2 99%  BP Readings from Last 3 Encounters:  04/27/15 100/60  04/25/15 100/67  10/06/14 118/68    Wt Readings from Last 3 Encounters:  04/27/15 132 lb (59.875 kg)  04/25/15 133 lb (60.328 kg)  10/06/14 134 lb 0.8 oz (60.805 kg)    Physical Exam  Constitutional: She is oriented to person, place, and time. No distress.  HENT:  Head: Normocephalic and atraumatic.  Mouth/Throat: Oropharynx is clear and moist. No oropharyngeal exudate.  Eyes: Conjunctivae are normal. Right eye exhibits no discharge. Left eye exhibits no discharge. No scleral icterus.  Neck: Normal range of motion. Neck supple. No JVD present. No tracheal deviation present. No thyromegaly present.  Cardiovascular: Normal rate, regular rhythm, normal heart sounds and intact distal pulses.  Exam reveals no gallop and no friction rub.   No murmur heard. Pulmonary/Chest: Effort normal and breath sounds normal. No stridor. No respiratory distress. She has no wheezes. She has no rales. She exhibits no tenderness.  Abdominal: Soft. Bowel sounds are normal. She exhibits no distension and no mass. There is no tenderness. There is no rebound and no guarding.  Musculoskeletal: Normal range of motion. She exhibits no edema or tenderness.       Thoracic back: Normal. She exhibits normal range of motion, no tenderness, no bony tenderness, no swelling, no edema, no deformity, no laceration, no pain, no spasm and normal pulse.  Lymphadenopathy:    She has no cervical adenopathy.  Neurological: She is oriented to person, place, and time.  Skin: Skin is warm and dry. No rash noted.  She is not diaphoretic. No erythema. No pallor.  Vitals reviewed.   Lab Results  Component Value Date   WBC 7.7 04/25/2015   HGB 12.9 04/25/2015   HCT 39.1 04/25/2015   PLT 235.0 04/25/2015   GLUCOSE 93 04/25/2015   CHOL 152 07/29/2014   TRIG 46.0 07/29/2014   HDL 48.10 07/29/2014   LDLCALC 95  07/29/2014   ALT 11 04/25/2015   AST 15 04/25/2015   NA 138 04/25/2015   K 3.8 04/25/2015   CL 104 04/25/2015   CREATININE 0.58 04/25/2015   BUN 7 04/25/2015   CO2 26 04/25/2015   TSH 0.01* 04/27/2015    Dg Thoracic Spine W/swimmers  04/28/2015   CLINICAL DATA:  Mid thoracic back pain for 6 months. No known injury.  EXAM: THORACIC SPINE - 3 VIEWS  COMPARISON:  12/05/2013  FINDINGS: There is no evidence of thoracic spine fracture. Alignment is normal. No other significant bone abnormalities are identified.  IMPRESSION: Negative.   Electronically Signed   By: Rolm Baptise M.D.   On: 04/28/2015 09:00    Assessment & Plan:   Meyer was seen today for back pain.  Diagnoses and all orders for this visit:  Midline thoracic back pain- her exams and plain films are normal. She appears to have mechanical back pain and will continue treating with over-the-counter medications. -     DG Thoracic Spine W/Swimmers; Future  Low TSH level- her TSH is even lower than before and she has a slightly elevated T4. I have asked her to see endocrinology for further evaluation of possible hyperthyroidism. -     T4, free; Future -     T4; Future -     T3, free; Future -     TSH; Future -     Thyroid peroxidase antibody; Future -     T3 uptake; Future -     Ambulatory referral to Endocrinology   I am having Lori [name] maintain her cholecalciferol, multivitamin, and levETIRAcetam.  No orders of the defined types were placed in this encounter.     Follow-up: Return in about 3 months (around 07/27/2015).  Lori Calico, MD

## 2015-05-03 ENCOUNTER — Encounter: Payer: Self-pay | Admitting: Internal Medicine

## 2015-05-03 ENCOUNTER — Ambulatory Visit (INDEPENDENT_AMBULATORY_CARE_PROVIDER_SITE_OTHER): Payer: 59 | Admitting: Internal Medicine

## 2015-05-03 VITALS — BP 118/60 | HR 62 | Temp 98.3°F | Resp 12 | Ht 62.0 in | Wt 132.0 lb

## 2015-05-03 DIAGNOSIS — E0590 Thyrotoxicosis, unspecified without thyrotoxic crisis or storm: Secondary | ICD-10-CM

## 2015-05-03 NOTE — Patient Instructions (Addendum)
Please stop at the lab at [facility].  Please come back for a follow-up appointment in 3 months.  Hyperthyroidism The thyroid is a large gland located in the lower front part of your neck. The thyroid helps control metabolism. Metabolism is how your body uses food. It controls metabolism with the hormone thyroxine. When the thyroid is overactive, it produces too much hormone. When this happens, these following problems may occur:   Nervousness  Heat intolerance  Weight loss (in spite of increase food intake)  Diarrhea  Change in hair or skin texture  Palpitations (heart skipping or having extra beats)  Tachycardia (rapid heart rate)  Loss of menstruation (amenorrhea)  Shaking of the hands CAUSES  Grave's Disease (the immune system attacks the thyroid gland). This is the most common cause.  Inflammation of the thyroid gland.  Tumor (usually benign) in the thyroid gland or elsewhere.  Excessive use of thyroid medications (both prescription and 'natural').  Excessive ingestion of Iodine. DIAGNOSIS  To prove hyperthyroidism, your caregiver may do blood tests and ultrasound tests. Sometimes the signs are hidden. It may be necessary for your caregiver to watch this illness with blood tests, either before or after diagnosis and treatment. TREATMENT Short-term treatment There are several treatments to control symptoms. Drugs called beta blockers may give some relief. Drugs that decrease hormone production will provide temporary relief in many people. These measures will usually not give permanent relief. Definitive therapy There are treatments available which can be discussed between you and your caregiver which will permanently treat the problem. These treatments range from surgery (removal of the thyroid), to the use of radioactive iodine (destroys the thyroid by radiation), to the use of antithyroid drugs (interfere with hormone synthesis). The first two treatments are permanent and  usually successful. They most often require hormone replacement therapy for life. This is because it is impossible to remove or destroy the exact amount of thyroid required to make a person euthyroid (normal). HOME CARE INSTRUCTIONS  See your caregiver if the problems you are being treated for get worse. Examples of this would be the problems listed above. SEEK MEDICAL CARE IF: Your general condition worsens. MAKE SURE YOU:   Understand these instructions.  Will watch your condition.  Will get help right away if you are not doing well or get worse. Document Released: 07/29/2005 Document Revised: 10/21/2011 Document Reviewed: 12/10/2006 [facility] Patient Information 2015 [place], [place]. This information is not intended to replace advice given to you by your health care provider. Make sure you discuss any questions you have with your health care provider.

## 2015-05-03 NOTE — Progress Notes (Signed)
Patient ID: Lori [name], female   DOB: 04-06-1971, 44 y.o.   MRN: [id number]   HPI  Lori [name] is a 44 y.o.-year-old female, referred by her PCP, Dr. Scarlette Calico, in consultation for thyrotoxicosis.  Approx. 2-3 mo ago >> started to feel more tired >> 2 weeks ago >> he started to feel very fatigued, with mm pain, weakness, then sore throat >> low TSH >> repeated TSH was still low while free T4 was normal and and free T3 slightly high. She continues to have mm pains and foot pain, she is still weak and tired.  She also remember having sinusitis + URI in 09/2014 >> also had palpitations >> EKG normal.   She started taking B12 and MVI >> stopped 1 mo ago.  I reviewed pt's thyroid tests: Component     Latest Ref Rng 04/25/2015 04/27/2015  TSH     0.35 - 4.50 uIU/mL 0.05 (L) 0.01 (L)  Free T4     0.60 - 1.60 ng/dL  1.40  T4, Total     4.5 - 12.0 ug/dL  10.2  T3, Free     2.3 - 4.2 pg/mL  4.7 (H)  Thyroperoxidase Ab SerPl-aCnc     <9 IU/mL  3   10/2013: TSH, free t4, free t3 normal  Pt denies feeling nodules in neck, hoarseness, dysphagia/odynophagia, SOB with lying down; she c/o: - + fatigue - no excessive sweating/heat intolerance - no tremors - no anxiety - no palpitations - no hyperdefecation - no weight loss - no hair loss  Pt does not have a FH of thyroid ds. No FH of thyroid cancer. No h/o radiation tx to head or neck.  No seaweed or kelp, no recent contrast studies. No steroid use. No Biotin use.  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 She has astigmatism >> feels vision more blurry recently.  ROS: Constitutional: + see HPI Eyes: no blurry vision, no xerophthalmia ENT: no sore throat, + post nasal drip, + see HPI Cardiovascular: no CP/SOB/palpitations/leg swelling Respiratory: no cough/SOB Gastrointestinal: no N/V/D/C Musculoskeletal: + muscle/+ joint aches Skin: no rashes Neurological: no  tremors/numbness/tingling/dizziness, + HA Psychiatric: no depression/anxiety, + irritability  Past Medical History  Diagnosis Date  . Seizure    No past surgical history on file. Social History   Social History  . Marital Status: Married    Spouse Name: N/A  . Number of Children: 2  . Years of Education: N/A   Occupational History  .  [employer]   Social History Main Topics  . Smoking status: Never Smoker   . Smokeless tobacco: Never Used  . Alcohol Use: No  . Drug Use: No  . Sexual Activity: Not on file   Other Topics Concern  . Not on file   Social History Narrative   Patient is married and has 2 children.          Current Outpatient Prescriptions on File Prior to Visit  Medication Sig Dispense Refill  . cholecalciferol (VITAMIN D) 1000 UNITS tablet Take 1,000 Units by mouth daily.    Marland Kitchen levETIRAcetam (KEPPRA XR) 500 MG 24 hr tablet Take 2 every morning 60 tablet 11  . Multiple Vitamin (MULTIVITAMIN) tablet Take 1 tablet by mouth daily.     No current facility-administered medications on file prior to visit.   No Known Allergies Family History  Problem Relation Age of Onset  . Diabetes  Father   . Anemia Father   . Seizures Neg Hx    PE: BP 118/60 mmHg  Pulse 62  Temp(Src) 98.3 F (36.8 C) (Oral)  Resp 12  Ht 5\' 2"  (1.575 m)  Wt 132 lb (59.875 kg)  BMI 24.14 kg/m2  SpO2 97% Wt Readings from Last 3 Encounters:  05/03/15 132 lb (59.875 kg)  04/27/15 132 lb (59.875 kg)  04/25/15 133 lb (60.328 kg)   Constitutional: normal weight, in NAD Eyes: PERRLA, EOMI, no exophthalmos, no lid lag, no stare ENT: moist mucous membranes, no thyromegaly, no thyroid bruits, no cervical lymphadenopathy Cardiovascular: RRR, No MRG Respiratory: CTA B Gastrointestinal: abdomen soft, NT, ND, BS+ Musculoskeletal: no deformities, strength intact in all 4 Skin: moist, warm, no rashes Neurological: no tremor with outstretched hands, DTR normal in all 4  ASSESSMENT: 1.   Thyrotoxicosis  PLAN:  1. Patient with a recently found low TSH x2, without thyrotoxic sxs, other than fatigue and mm pains which could be from a recent flu-like episode. - she does not appear to have exogenous causes for the low TSH.  - We discussed that possible causes of thyrotoxicosis are:  Graves ds   Thyroiditis toxic multinodular goiter/ toxic adenoma (I cannot feel nodules at palpation of her thyroid). - we reviewed her recent TSH, fT3 and fT4 tests together >> will need to repeat them in 1-1.5 mo. - will get a thyroid uptake and scan to differentiate between the 3 above possible etiologies  - we discussed about possible modalities of treatment for the above conditions, to include methimazole use, radioactive iodine ablation or (last resort) surgery. - I did advice her that we might need to do thyroid ultrasound depending on the results of the uptake and scan (if a cold nodule is present) - I do not feel that we need to add beta blockers at this time, since she is not tachycardic, anxious, or tremulous - RTC in 3 months, but likely sooner for repeat labs  CLINICAL DATA: Thyrotoxicosis without thyroid storm, TSH = 0.01  EXAM: THYROID SCAN AND UPTAKE - 24 HOURS  TECHNIQUE: Following the per oral administration of I-131 sodium iodide, the patient returned at 24 hours and uptake measurements were acquired with the uptake probe centered on the neck. Thyroid imaging was performed following the intravenous administration of the Tc-7m Pertechnetate.  RADIOPHARMACEUTICALS: 8.6 microCuries I-131 sodium iodide orally and 9.8 mCi Technetium-6m pertechnetate IV  COMPARISON: None  FINDINGS: 24 hour radio iodine uptake calculated at 0.9%.  Images of the thyroid gland in 3 projections demonstrate only mild diffuse tracer localization throughout the thyroid lobes.  Small focal area of increased trace localization is seen in the mid RIGHT lobe consistent with a small hyper  functional adenoma.  IMPRESSION: Markedly decreased 24 hour radio iodine uptake of 9%. 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   Electronically Signed  By: Lavonia Dana M.D.  On: 05/10/2015 15:17  The overall low radioactive iodine uptake point towards subacute thyroiditis as a culprit for patient's thyrotoxicosis. I discussed with the patient about the finding, and suggested to come back in another week for labs. With thyrotoxicosis, we should see improvement in her thyroid tests. No beta blockers necessary, since her pulse was in the 60s at the time of the appointment, but I also advised her to check her pulse at home and let me know if this is more than 90 at rest.  The thyroid scan shows  that she has a small right functional adenoma, which may not be of clinical significance after the episode of thyroiditis results. I doubt that this small focus of hyperactivity is the reason for patient's suppressed TSH.

## 2015-05-09 ENCOUNTER — Ambulatory Visit (HOSPITAL_COMMUNITY)
Admission: RE | Admit: 2015-05-09 | Discharge: 2015-05-09 | Disposition: A | Discharge status: Home / Self Care | Payer: 59 | Source: Ambulatory Visit | Attending: Internal Medicine | Admitting: Internal Medicine

## 2015-05-09 DIAGNOSIS — E0590 Thyrotoxicosis, unspecified without thyrotoxic crisis or storm: Secondary | ICD-10-CM | POA: Insufficient documentation

## 2015-05-09 DIAGNOSIS — D34 Benign neoplasm of thyroid gland: Secondary | ICD-10-CM | POA: Insufficient documentation

## 2015-05-10 ENCOUNTER — Encounter (HOSPITAL_COMMUNITY)
Admission: RE | Admit: 2015-05-10 | Discharge: 2015-05-10 | Disposition: A | Discharge status: Home / Self Care | Payer: 59 | Source: Ambulatory Visit | Attending: Internal Medicine | Admitting: Internal Medicine

## 2015-05-10 DIAGNOSIS — E0590 Thyrotoxicosis, unspecified without thyrotoxic crisis or storm: Secondary | ICD-10-CM | POA: Diagnosis not present

## 2015-05-10 DIAGNOSIS — D34 Benign neoplasm of thyroid gland: Secondary | ICD-10-CM | POA: Diagnosis not present

## 2015-05-10 MED ORDER — SODIUM PERTECHNETATE TC 99M INJECTION
9.8000 | Freq: Once | INTRAVENOUS | Status: AC | PRN
  Administered 2015-05-10: 10 via INTRAVENOUS

## 2015-05-10 MED ORDER — SODIUM IODIDE I 131 CAPSULE
8.6000 | Freq: Once | INTRAVENOUS | Status: AC
  Administered 2015-05-09: 8.6 via ORAL

## 2015-05-11 ENCOUNTER — Other Ambulatory Visit: Payer: Self-pay | Admitting: Internal Medicine

## 2015-05-11 DIAGNOSIS — E0590 Thyrotoxicosis, unspecified without thyrotoxic crisis or storm: Secondary | ICD-10-CM

## 2015-05-15 ENCOUNTER — Encounter (HOSPITAL_COMMUNITY): Payer: 59

## 2015-05-16 ENCOUNTER — Encounter (HOSPITAL_COMMUNITY): Payer: 59

## 2015-05-19 ENCOUNTER — Encounter: Payer: Self-pay | Admitting: Internal Medicine

## 2015-05-19 ENCOUNTER — Other Ambulatory Visit: Payer: Self-pay | Admitting: Internal Medicine

## 2015-05-19 ENCOUNTER — Other Ambulatory Visit (INDEPENDENT_AMBULATORY_CARE_PROVIDER_SITE_OTHER): Payer: 59

## 2015-05-19 DIAGNOSIS — E0590 Thyrotoxicosis, unspecified without thyrotoxic crisis or storm: Secondary | ICD-10-CM | POA: Diagnosis not present

## 2015-05-19 DIAGNOSIS — R7989 Other specified abnormal findings of blood chemistry: Secondary | ICD-10-CM

## 2015-05-19 LAB — TSH: TSH: 0.04 u[IU]/mL — AB (ref 0.35–4.50)

## 2015-05-19 LAB — T4, FREE: Free T4: 1.1 ng/dL (ref 0.60–1.60)

## 2015-05-19 LAB — T3, FREE: T3, Free: 4.4 pg/mL — ABNORMAL HIGH (ref 2.3–4.2)

## 2015-06-12 ENCOUNTER — Other Ambulatory Visit (INDEPENDENT_AMBULATORY_CARE_PROVIDER_SITE_OTHER): Payer: 59

## 2015-06-12 DIAGNOSIS — R946 Abnormal results of thyroid function studies: Secondary | ICD-10-CM | POA: Diagnosis not present

## 2015-06-12 DIAGNOSIS — R7989 Other specified abnormal findings of blood chemistry: Secondary | ICD-10-CM

## 2015-06-12 LAB — TSH: TSH: 1.45 u[IU]/mL (ref 0.35–4.50)

## 2015-06-12 LAB — T4, FREE: FREE T4: 0.91 ng/dL (ref 0.60–1.60)

## 2015-06-12 LAB — T3, FREE: T3 FREE: 4.1 pg/mL (ref 2.3–4.2)

## 2015-06-13 ENCOUNTER — Other Ambulatory Visit: Payer: Self-pay | Admitting: Internal Medicine

## 2015-06-13 DIAGNOSIS — E061 Subacute thyroiditis: Secondary | ICD-10-CM

## 2015-06-15 ENCOUNTER — Ambulatory Visit (INDEPENDENT_AMBULATORY_CARE_PROVIDER_SITE_OTHER): Payer: 59 | Admitting: Adult Health

## 2015-06-15 ENCOUNTER — Encounter: Payer: Self-pay | Admitting: Adult Health

## 2015-06-15 VITALS — BP 93/62 | HR 80 | Ht 62.0 in | Wt 135.0 lb

## 2015-06-15 DIAGNOSIS — R569 Unspecified convulsions: Secondary | ICD-10-CM | POA: Diagnosis not present

## 2015-06-15 MED ORDER — LEVETIRACETAM ER 500 MG PO TB24: ORAL_TABLET | ORAL | Status: DC

## 2015-06-15 NOTE — Progress Notes (Signed)
PATIENT: Lori [name] DOB: [birth year]  REASON FOR VISIT: follow up- seizures HISTORY FROM: patient  HISTORY OF PRESENT ILLNESS: Lori [name] is a 44 year old female with a history of seizures. She returns today for follow-up. She is currently taking Keppra XR 1000 mg daily. She denies any seizure event since the last visit. She operates a Teacher, music without difficulty. She is able to complete all ADLs independently. Denies any changes in her mood. Denies any new medical issues. She returns today for an evaluation.  HISTORY 06/16/14 (WILLIS): Lori [name] is a 44 year old right-handed [place] female with a history of seizures. The patient has done quite well over the last year with her seizures. The patient has had no recurrence. She has dropped back from taking 1500 mg daily to 1000 mg daily of the Keppra XR tablet. The patient is tolerating medication well, and she indicates that her last seizure was in 2013. She is operating a motor vehicle without problems. She denies any new medical issues that have come up since last seen.  REVIEW OF SYSTEMS: Out of a complete 14 system review of symptoms, the patient complains only of the following symptoms, and all other reviewed systems are negative.  See HPI  ALLERGIES: No Known Allergies  HOME MEDICATIONS: Outpatient Prescriptions Prior to Visit  Medication Sig Dispense Refill  . cholecalciferol (VITAMIN D) 1000 UNITS tablet Take 1,000 Units by mouth daily.    Marland Kitchen levETIRAcetam (KEPPRA XR) 500 MG 24 hr tablet Take 2 every morning 60 tablet 11  . Multiple Vitamin (MULTIVITAMIN) tablet Take 1 tablet by mouth daily.     No facility-administered medications prior to visit.    PAST MEDICAL HISTORY: Past Medical History  Diagnosis Date  . Seizure ([place])     PAST SURGICAL HISTORY: History reviewed. No pertinent past surgical history.  FAMILY HISTORY: Family History  Problem Relation Age of Onset  . Diabetes Father   . Anemia Father   .  Seizures Neg Hx     SOCIAL HISTORY: Social History   Social History  . Marital Status: Married    Spouse Name: N/A  . Number of Children: 2  . Years of Education: N/A   Occupational History  .  [employer]   Social History Main Topics  . Smoking status: Never Smoker   . Smokeless tobacco: Never Used  . Alcohol Use: No  . Drug Use: No  . Sexual Activity: Not on file   Other Topics Concern  . Not on file   Social History Narrative   Patient is married and has 2 children.             PHYSICAL EXAM  Filed Vitals:   06/15/15 0822  BP: 93/62  Pulse: 80  Height: _0  (1.575 m)  Weight: 135 lb (61.236 kg)   Body mass index is 24.69 kg/(m^2).  Generalized: Well developed, in no acute distress   Neurological examination  Mentation: Alert oriented to time, place, history taking. Follows all commands speech and language fluent Cranial nerve II-XII: Pupils were equal round reactive to light. Extraocular movements were full, visual field were full on confrontational test. Facial sensation and strength were normal. Uvula tongue midline. Head turning and shoulder shrug  were normal and symmetric. Motor: The motor testing reveals 5 over 5 strength of all 4 extremities. Good symmetric motor tone is noted throughout.  Sensory: Sensory testing is intact to soft touch on all 4 extremities. No evidence of extinction is noted.  Coordination: Cerebellar testing reveals good finger-nose-finger and heel-to-shin bilaterally.  Gait and station: Gait is normal. Tandem gait is normal. Romberg is negative. No drift is seen.  Reflexes: Deep tendon reflexes are symmetric and normal bilaterally.   DIAGNOSTIC DATA (LABS, IMAGING, TESTING) - I reviewed patient records, labs, notes, testing and imaging myself where available.  Lab Results  Component Value Date   WBC 7.7 04/25/2015   HGB 12.9 04/25/2015   HCT 39.1 04/25/2015   MCV 87.3 04/25/2015   PLT 235.0 04/25/2015      Component  Value Date/Time   NA 138 04/25/2015 1126   K 3.8 04/25/2015 1126   CL 104 04/25/2015 1126   CO2 26 04/25/2015 1126   GLUCOSE 93 04/25/2015 1126   BUN 7 04/25/2015 1126   CREATININE 0.58 04/25/2015 1126   CALCIUM 9.3 04/25/2015 1126   PROT 7.6 04/25/2015 1126   ALBUMIN 4.2 04/25/2015 1126   AST 15 04/25/2015 1126   ALT 11 04/25/2015 1126   ALKPHOS 57 04/25/2015 1126   BILITOT 0.3 04/25/2015 1126   GFRNONAA >60 09/21/2010 0630   GFRAA  09/21/2010 0630    >60        The eGFR has been calculated using the MDRD equation. This calculation has not been validated in all clinical situations. eGFR's persistently <60 mL/min signify possible Chronic Kidney Disease.   Lab Results  Component Value Date   CHOL 152 07/29/2014   HDL 48.10 07/29/2014   LDLCALC 95 07/29/2014   TRIG 46.0 07/29/2014   CHOLHDL 3 07/29/2014   Lab Results  Component Value Date   TSH 1.45 06/12/2015      ASSESSMENT AND PLAN 44 y.o. year old female  has a past medical history of Seizure ([place]). here with:  1. Seizures  Overall the patient is doing well. She will continue on Keppra XR 1000 mg daily. Patient advised that if she has any seizure events she should let us know.   She will follow-up in one year or sooner if needed.  Ward Givens, MSN, NP-C 06/15/2015, 8:31 AM [facility] [address], [place],  [address] [phone]

## 2015-06-15 NOTE — Patient Instructions (Signed)
Continue Keppra If you have any seizure events please let us know.   

## 2015-06-30 ENCOUNTER — Telehealth: Payer: Self-pay | Admitting: Neurology

## 2015-06-30 NOTE — Telephone Encounter (Signed)
Pt called sts she will be leaving the country on 07/24/15 and will be returning 08/11/15. She is requesting additional refill to take with her. Please call and advise. Can call wk# M-F 8-5, home # otherwise.

## 2015-06-30 NOTE — Telephone Encounter (Signed)
It appears 12 refills for Keppra were sent on 11/03.  I called back to clarify.  Patient would like [place] to call the pharmacy and tell them she wants to ensure she has medication for her trip next month.  I called the pharmacy and spoke with [place].  She verified they will dispense enough to ensure patient has meds to last through her trip (#120 instead of #60).  I called the patient back to advise.  She expressed understanding and appreciation.

## 2015-08-17 ENCOUNTER — Other Ambulatory Visit (INDEPENDENT_AMBULATORY_CARE_PROVIDER_SITE_OTHER): Payer: 59

## 2015-08-17 DIAGNOSIS — E061 Subacute thyroiditis: Secondary | ICD-10-CM | POA: Diagnosis not present

## 2015-08-17 LAB — TSH: TSH: 2.92 u[IU]/mL (ref 0.35–4.50)

## 2015-08-17 LAB — T3, FREE: T3 FREE: 4.1 pg/mL (ref 2.3–4.2)

## 2015-08-17 LAB — T4, FREE: Free T4: 0.81 ng/dL (ref 0.60–1.60)

## 2015-08-30 MED FILL — LEVETIRACETAM ER 500 MG TAB: 500 | 30 days supply | Qty: 60 | Fill #0

## 2015-09-14 MED FILL — AMOXICILLIN 500 MG CAPSULE: 500 | 15 days supply | Qty: 30 | Fill #0

## 2015-09-14 MED FILL — HYDROCODON-APAP 5-325: 5-325 | 2 days supply | Qty: 10 | Fill #0

## 2015-09-28 MED FILL — LEVETIRACETAM ER 500 MG TAB: 500 | 30 days supply | Qty: 60 | Fill #1

## 2015-11-06 MED FILL — LEVETIRACETAM ER 500 MG TAB: 500 | 30 days supply | Qty: 60 | Fill #2

## 2015-11-17 DIAGNOSIS — N951 Menopausal and female climacteric states: Secondary | ICD-10-CM | POA: Diagnosis not present

## 2015-11-17 DIAGNOSIS — Z30433 Encounter for removal and reinsertion of intrauterine contraceptive device: Secondary | ICD-10-CM | POA: Diagnosis not present

## 2015-12-07 MED FILL — LEVETIRACETAM ER 500 MG TAB: 500 | 30 days supply | Qty: 60 | Fill #3

## 2016-01-04 MED FILL — LEVETIRACETAM ER 500 MG TAB: 500 | 30 days supply | Qty: 60 | Fill #4

## 2016-01-17 DIAGNOSIS — Z01419 Encounter for gynecological examination (general) (routine) without abnormal findings: Secondary | ICD-10-CM | POA: Diagnosis not present

## 2016-01-17 DIAGNOSIS — Z6824 Body mass index (BMI) 24.0-24.9, adult: Secondary | ICD-10-CM | POA: Diagnosis not present

## 2016-01-17 DIAGNOSIS — Z1231 Encounter for screening mammogram for malignant neoplasm of breast: Secondary | ICD-10-CM | POA: Diagnosis not present

## 2016-02-06 MED FILL — LEVETIRACETAM ER 500 MG TAB: 500 | 30 days supply | Qty: 60 | Fill #5

## 2016-03-06 MED FILL — LEVETIRACETAM ER 500 MG TAB: 500 | 30 days supply | Qty: 60 | Fill #6

## 2016-04-05 ENCOUNTER — Ambulatory Visit (INDEPENDENT_AMBULATORY_CARE_PROVIDER_SITE_OTHER): Payer: 59 | Admitting: Internal Medicine

## 2016-04-05 ENCOUNTER — Other Ambulatory Visit (INDEPENDENT_AMBULATORY_CARE_PROVIDER_SITE_OTHER): Payer: 59

## 2016-04-05 ENCOUNTER — Encounter: Payer: Self-pay | Admitting: Internal Medicine

## 2016-04-05 VITALS — BP 132/62 | HR 61 | Temp 97.5°F | Resp 14 | Ht 63.0 in | Wt 136.0 lb

## 2016-04-05 DIAGNOSIS — Z0000 Encounter for general adult medical examination without abnormal findings: Secondary | ICD-10-CM | POA: Diagnosis not present

## 2016-04-05 LAB — COMPREHENSIVE METABOLIC PANEL
ALBUMIN: 4 g/dL (ref 3.5–5.2)
ALK PHOS: 55 U/L (ref 39–117)
ALT: 10 U/L (ref 0–35)
AST: 14 U/L (ref 0–37)
BUN: 9 mg/dL (ref 6–23)
CO2: 28 mEq/L (ref 19–32)
CREATININE: 0.69 mg/dL (ref 0.40–1.20)
Calcium: 8.8 mg/dL (ref 8.4–10.5)
Chloride: 104 mEq/L (ref 96–112)
GFR: 97.45 mL/min (ref >60.00)
Glucose, Bld: 97 mg/dL (ref 70–99)
Potassium: 3.5 mEq/L (ref 3.5–5.1)
SODIUM: 139 meq/L (ref 135–145)
TOTAL PROTEIN: 7.2 g/dL (ref 6.0–8.3)
Total Bilirubin: 0.5 mg/dL (ref 0.2–1.2)

## 2016-04-05 LAB — CBC
HCT: 37.6 % (ref 36.0–46.0)
Hemoglobin: 12.5 g/dL (ref 12.0–15.0)
MCHC: 33.2 g/dL (ref 30.0–36.0)
MCV: 87 fl (ref 78.0–100.0)
PLATELETS: 225 10*3/uL (ref 150.0–400.0)
RBC: 4.32 Mil/uL (ref 3.87–5.11)
RDW: 12.7 % (ref 11.5–15.5)
WBC: 7.3 10*3/uL (ref 4.0–10.5)

## 2016-04-05 LAB — URINALYSIS, ROUTINE W REFLEX MICROSCOPIC
BILIRUBIN URINE: NEGATIVE
Hgb urine dipstick: NEGATIVE
LEUKOCYTES UA: NEGATIVE
Nitrite: NEGATIVE
PH: 6 (ref 5.0–8.0)
RBC / HPF: NONE SEEN (ref 0–?)
Specific Gravity, Urine: 1.025 (ref 1.000–1.030)
TOTAL PROTEIN, URINE-UPE24: NEGATIVE
URINE GLUCOSE: NEGATIVE
UROBILINOGEN UA: 0.2 (ref 0.0–1.0)
WBC, UA: NONE SEEN (ref 0–?)

## 2016-04-05 LAB — T4, FREE: FREE T4: 0.84 ng/dL (ref 0.60–1.60)

## 2016-04-05 LAB — LIPID PANEL
CHOLESTEROL: 145 mg/dL (ref 0–200)
HDL: 51.5 mg/dL (ref >39.00)
LDL CALC: 79 mg/dL (ref 0–99)
NonHDL: 93.06
Total CHOL/HDL Ratio: 3
Triglycerides: 71 mg/dL (ref 0.0–149.0)
VLDL: 14.2 mg/dL (ref 0.0–40.0)

## 2016-04-05 LAB — VITAMIN D 25 HYDROXY (VIT D DEFICIENCY, FRACTURES): VITD: 35.11 ng/mL (ref 30.00–100.00)

## 2016-04-05 LAB — SEDIMENTATION RATE: Sed Rate: 17 mm/hr (ref 0–20)

## 2016-04-05 LAB — TSH: TSH: 2.02 u[IU]/mL (ref 0.35–4.50)

## 2016-04-05 MED ORDER — FLUTICASONE PROPIONATE 50 MCG/ACT NA SUSP: 2.0000 | Freq: Every day | NASAL | 6 refills | Status: DC

## 2016-04-05 MED FILL — FLUTICASONE PROP 50 MCG SPR: 50 | 30 days supply | Qty: 16 | Fill #0

## 2016-04-05 MED FILL — LEVETIRACETAM ER 500 MG TAB: 500 | 30 days supply | Qty: 60 | Fill #7

## 2016-04-05 NOTE — Progress Notes (Signed)
   Subjective:    Patient ID: Lori [name], female    DOB: 1971-04-06, 45 y.o.   MRN: [id number]  HPI The patient is a 45 YO female coming in for physical. No new concerns. Please see A/P for status and treatment of chronic medical problems.   PMH, [facility], social history reviewed and updated.  Review of Systems  Constitutional: Negative for activity change, appetite change, fatigue, fever and unexpected weight change.  HENT: Negative.   Eyes: Negative.   Respiratory: Negative for cough, chest tightness, shortness of breath and wheezing.   Cardiovascular: Negative for chest pain, palpitations and leg swelling.  Gastrointestinal: Negative for abdominal distention, abdominal pain, constipation, diarrhea and nausea.  Endocrine: Negative for cold intolerance, heat intolerance and polydipsia.  Musculoskeletal: Positive for myalgias. Negative for arthralgias, back pain and gait problem.  Skin: Negative.   Neurological: Negative for dizziness, seizures, syncope, weakness, light-headedness, numbness and headaches.  Psychiatric/Behavioral: Negative.       Objective:   Physical Exam  Constitutional: She is oriented to person, place, and time. She appears well-developed and well-nourished.  HENT:  Head: Normocephalic and atraumatic.  Eyes: EOM are normal.  Neck: Normal range of motion. Neck supple.  Cardiovascular: Normal rate and regular rhythm.   Pulmonary/Chest: Effort normal and breath sounds normal. No respiratory distress. She has no wheezes. She has no rales.  Abdominal: Soft. Bowel sounds are normal. She exhibits no distension. There is no tenderness. There is no rebound.  Musculoskeletal: She exhibits no edema.  Left trochanteric bursitis mild  Neurological: She is alert and oriented to person, place, and time. Coordination normal.  Skin: Skin is warm and dry.   Vitals:   04/05/16 1001  BP: 132/62  Pulse: 61  Resp: 14  Temp: 97.5 F (36.4 C)  TempSrc: Oral  SpO2: 99%    Weight: 136 lb (61.7 kg)  Height: 5\' 3"  (1.6 m)      Assessment & Plan:

## 2016-04-05 NOTE — Progress Notes (Signed)
Pre visit review using our clinic review tool, if applicable. No additional management support is needed unless otherwise documented below in the visit note. 

## 2016-04-05 NOTE — Assessment & Plan Note (Signed)
Checking labs, gets flu shot at her work. Exercising regularly and non-smoker. Counseled on sun safety and mole surveillance, as well as the dangers of distracted driving. Given screening recommendations.

## 2016-04-05 NOTE — Patient Instructions (Signed)
We will check the blood and the urine today.   The pain in the hip is likely trochanteric bursitis and you can do the stretches below as well as aleve for the pain.    Trochanteric Bursitis You have hip pain due to trochanteric bursitis. Bursitis means that the sack near the outside of the hip is filled with fluid and inflamed. This sack is made up of protective soft tissue. The pain from trochanteric bursitis can be severe and keep you from sleep. It can radiate to the buttocks or down the outside of the thigh to the knee. The pain is almost always worse when rising from the seated or lying position and with walking. Pain can improve after you take a few steps. It happens more often in people with hip joint and lumbar spine problems, such as arthritis or previous surgery. Very rarely the trochanteric bursa can become infected, and antibiotics and/or surgery may be needed. Treatment often includes an injection of local anesthetic mixed with cortisone medicine. This medicine is injected into the area where it is most tender over the hip. Repeat injections may be necessary if the response to treatment is slow. You can apply ice packs over the tender area for 30 minutes every 2 hours for the next few days. Anti-inflammatory and/or narcotic pain medicine may also be helpful. Limit your activity for the next few days if the pain continues. See your caregiver in 5-10 days if you are not greatly improved.  SEEK IMMEDIATE MEDICAL CARE IF:  You develop severe pain, fever, or increased redness.  You have pain that radiates below the knee. EXERCISES STRETCHING EXERCISES - Trochanteric Bursitis  These exercises may help you when beginning to rehabilitate your injury. Your symptoms may resolve with or without further involvement from your physician, physical therapist, or athletic trainer. While completing these exercises, remember:   Restoring tissue flexibility helps normal motion to return to the joints. This  allows healthier, less painful movement and activity.  An effective stretch should be held for at least 30 seconds.  A stretch should never be painful. You should only feel a gentle lengthening or release in the stretched tissue. STRETCH - Iliotibial Band  On the floor or bed, lie on your side so your injured leg is on top. Bend your knee and grab your ankle.  Slowly bring your knee back so that your thigh is in line with your trunk. Keep your heel at your buttocks and gently arch your back so your head, shoulders and hips line up.  Slowly lower your leg so that your knee approaches the floor/bed until you feel a gentle stretch on the outside of your thigh. If you do not feel a stretch and your knee will not fall farther, place the heel of your opposite foot on top of your knee and pull your thigh down farther.  Hold this stretch for __________ seconds.  Repeat __________ times. Complete this exercise __________ times per day. STRETCH - Hamstrings, Supine   Lie on your back. Loop a belt or towel over the ball of your foot as shown.  Straighten your knee and slowly pull on the belt to raise your injured leg. Do not allow the knee to bend. Keep your opposite leg flat on the floor.  Raise the leg until you feel a gentle stretch behind your knee or thigh. Hold this position for __________ seconds.  Repeat __________ times. Complete this stretch __________ times per day. STRETCH - Quadriceps, Prone  Lie on your stomach on a firm surface, such as a bed or padded floor.  Bend your knee and grasp your ankle. If you are unable to reach your ankle or pant leg, use a belt around your foot to lengthen your reach.  Gently pull your heel toward your buttocks. Your knee should not slide out to the side. You should feel a stretch in the front of your thigh and/or knee.  Hold this position for __________ seconds.  Repeat __________ times. Complete this stretch __________ times per day. STRETCHING  - Hip Flexors, Lunge Half kneel with your knee on the floor and your opposite knee bent and directly over your ankle.  Keep good posture with your head over your shoulders. Tighten your buttocks to point your tailbone downward; this will prevent your back from arching too much.  You should feel a gentle stretch in the front of your thigh and/or hip. If you do not feel any resistance, slightly slide your opposite foot forward and then slowly lunge forward so your knee once again lines up over your ankle. Be sure your tailbone remains pointed downward.  Hold this stretch for __________ seconds.  Repeat __________ times. Complete this stretch __________ times per day. STRETCH - Adductors, Lunge  While standing, spread your legs.  Lean away from your injured leg by bending your opposite knee. You may rest your hands on your thigh for balance.  You should feel a stretch in your inner thigh. Hold for __________ seconds.  Repeat __________ times. Complete this exercise __________ times per day.   This information is not intended to replace advice given to you by your health care provider. Make sure you discuss any questions you have with your health care provider.   Document Released: 09/05/2004 Document Revised: 12/13/2014 Document Reviewed: 11/10/2008 Elsevier Interactive Patient Education 2016 [place] Maintenance, Female Adopting a healthy lifestyle and getting preventive care can go a long way to promote health and wellness. Talk with your health care provider about what schedule of regular examinations is right for you. This is a good chance for you to check in with your provider about disease prevention and staying healthy. In between checkups, there are plenty of things you can do on your own. Experts have done a lot of research about which lifestyle changes and preventive measures are most likely to keep you healthy. Ask your health care provider for more  information. WEIGHT AND DIET  Eat a healthy diet  Be sure to include plenty of vegetables, fruits, low-fat dairy products, and lean protein.  Do not eat a lot of foods high in solid fats, added sugars, or salt.  Get regular exercise. This is one of the most important things you can do for your health.  Most adults should exercise for at least 150 minutes each week. The exercise should increase your heart rate and make you sweat (moderate-intensity exercise).  Most adults should also do strengthening exercises at least twice a week. This is in addition to the moderate-intensity exercise.  Maintain a healthy weight  Body mass index (BMI) is a measurement that can be used to identify possible weight problems. It estimates body fat based on height and weight. Your health care provider can help determine your BMI and help you achieve or maintain a healthy weight.  For females 28 years of age and older:   A BMI below 18.5 is considered underweight.  A BMI of 18.5 to 24.9 is normal.  A BMI  of 25 to 29.9 is considered overweight.  A BMI of 30 and above is considered obese.  Watch levels of cholesterol and blood lipids  You should start having your blood tested for lipids and cholesterol at 46 years of age, then have this test every 5 years.  You may need to have your cholesterol levels checked more often if:  Your lipid or cholesterol levels are high.  You are older than 46 years of age.  You are at high risk for heart disease.  CANCER SCREENING   Lung Cancer  Lung cancer screening is recommended for adults 77-22 years old who are at high risk for lung cancer because of a history of smoking.  A yearly low-dose CT scan of the lungs is recommended for people who:  Currently smoke.  Have quit within the past 15 years.  Have at least a 30-pack-year history of smoking. A pack year is smoking an average of one pack of cigarettes a day for 1 year.  Yearly screening should  continue until it has been 15 years since you quit.  Yearly screening should stop if you develop a health problem that would prevent you from having lung cancer treatment.  Breast Cancer  Practice breast self-awareness. This means understanding how your breasts normally appear and feel.  It also means doing regular breast self-exams. Let your health care provider know about any changes, no matter how small.  If you are in your 20s or 30s, you should have a clinical breast exam (CBE) by a health care provider every 1-3 years as part of a regular health exam.  If you are 1 or older, have a CBE every year. Also consider having a breast X-ray (mammogram) every year.  If you have a family history of breast cancer, talk to your health care provider about genetic screening.  If you are at high risk for breast cancer, talk to your health care provider about having an MRI and a mammogram every year.  Breast cancer gene (BRCA) assessment is recommended for women who have family members with BRCA-related cancers. BRCA-related cancers include:  Breast.  Ovarian.  Tubal.  Peritoneal cancers.  Results of the assessment will determine the need for genetic counseling and BRCA1 and BRCA2 testing. Cervical Cancer Your health care provider may recommend that you be screened regularly for cancer of the pelvic organs (ovaries, uterus, and vagina). This screening involves a pelvic examination, including checking for microscopic changes to the surface of your cervix (Pap test). You may be encouraged to have this screening done every 3 years, beginning at age 72.  For women ages 61-65, health care providers may recommend pelvic exams and Pap testing every 3 years, or they may recommend the Pap and pelvic exam, combined with testing for human papilloma virus (HPV), every 5 years. Some types of HPV increase your risk of cervical cancer. Testing for HPV may also be done on women of any age with unclear Pap  test results.  Other health care providers may not recommend any screening for nonpregnant women who are considered low risk for pelvic cancer and who do not have symptoms. Ask your health care provider if a screening pelvic exam is right for you.  If you have had past treatment for cervical cancer or a condition that could lead to cancer, you need Pap tests and screening for cancer for at least 20 years after your treatment. If Pap tests have been discontinued, your risk factors (such as having a new  sexual partner) need to be reassessed to determine if screening should resume. Some women have medical problems that increase the chance of getting cervical cancer. In these cases, your health care provider may recommend more frequent screening and Pap tests. Colorectal Cancer  This type of cancer can be detected and often prevented.  Routine colorectal cancer screening usually begins at 46 years of age and continues through 46 years of age.  Your health care provider may recommend screening at an earlier age if you have risk factors for colon cancer.  Your health care provider may also recommend using home test kits to check for hidden blood in the stool.  A small camera at the end of a tube can be used to examine your colon directly (sigmoidoscopy or colonoscopy). This is done to check for the earliest forms of colorectal cancer.  Routine screening usually begins at age 19.  Direct examination of the colon should be repeated every 5-10 years through 46 years of age. However, you may need to be screened more often if early forms of precancerous polyps or small growths are found. Skin Cancer  Check your skin from head to toe regularly.  Tell your health care provider about any new moles or changes in moles, especially if there is a change in a mole's shape or color.  Also tell your health care provider if you have a mole that is larger than the size of a pencil eraser.  Always use sunscreen.  Apply sunscreen liberally and repeatedly throughout the day.  Protect yourself by wearing long sleeves, pants, a wide-brimmed hat, and sunglasses whenever you are outside. HEART DISEASE, DIABETES, AND HIGH BLOOD PRESSURE   High blood pressure causes heart disease and increases the risk of stroke. High blood pressure is more likely to develop in:  People who have blood pressure in the high end of the normal range (130-139/85-89 mm Hg).  People who are overweight or obese.  People who are African American.  If you are 41-27 years of age, have your blood pressure checked every 3-5 years. If you are 21 years of age or older, have your blood pressure checked every year. You should have your blood pressure measured twice--once when you are at a hospital or clinic, and once when you are not at a hospital or clinic. Record the average of the two measurements. To check your blood pressure when you are not at a hospital or clinic, you can use:  An automated blood pressure machine at a pharmacy.  A home blood pressure monitor.  If you are between 59 years and 69 years old, ask your health care provider if you should take aspirin to prevent strokes.  Have regular diabetes screenings. This involves taking a blood sample to check your fasting blood sugar level.  If you are at a normal weight and have a low risk for diabetes, have this test once every three years after 46 years of age.  If you are overweight and have a high risk for diabetes, consider being tested at a younger age or more often. PREVENTING INFECTION  Hepatitis B  If you have a higher risk for hepatitis B, you should be screened for this virus. You are considered at high risk for hepatitis B if:  You were born in a country where hepatitis B is common. Ask your health care provider which countries are considered high risk.  Your parents were born in a high-risk country, and you have not been immunized against hepatitis  B (hepatitis B  vaccine).  You have HIV or AIDS.  You use needles to inject street drugs.  You live with someone who has hepatitis B.  You have had sex with someone who has hepatitis B.  You get hemodialysis treatment.  You take certain medicines for conditions, including cancer, organ transplantation, and autoimmune conditions. Hepatitis C  Blood testing is recommended for:  Everyone born from [birth year] through 1966.  Anyone with known risk factors for hepatitis C. Sexually transmitted infections (STIs)  You should be screened for sexually transmitted infections (STIs) including gonorrhea and chlamydia if:  You are sexually active and are younger than 46 years of age.  You are older than 46 years of age and your health care provider tells you that you are at risk for this type of infection.  Your sexual activity has changed since you were last screened and you are at an increased risk for chlamydia or gonorrhea. Ask your health care provider if you are at risk.  If you do not have HIV, but are at risk, it may be recommended that you take a prescription medicine daily to prevent HIV infection. This is called pre-exposure prophylaxis (PrEP). You are considered at risk if:  You are sexually active and do not regularly use condoms or know the HIV status of your partner(s).  You take drugs by injection.  You are sexually active with a partner who has HIV. Talk with your health care provider about whether you are at high risk of being infected with HIV. If you choose to begin PrEP, you should first be tested for HIV. You should then be tested every 3 months for as long as you are taking PrEP.  PREGNANCY   If you are premenopausal and you may become pregnant, ask your health care provider about preconception counseling.  If you may become pregnant, take 400 to 800 micrograms (mcg) of folic acid every day.  If you want to prevent pregnancy, talk to your health care provider about birth control  (contraception). OSTEOPOROSIS AND MENOPAUSE   Osteoporosis is a disease in which the bones lose minerals and strength with aging. This can result in serious bone fractures. Your risk for osteoporosis can be identified using a bone density scan.  If you are 55 years of age or older, or if you are at risk for osteoporosis and fractures, ask your health care provider if you should be screened.  Ask your health care provider whether you should take a calcium or vitamin D supplement to lower your risk for osteoporosis.  Menopause may have certain physical symptoms and risks.  Hormone replacement therapy may reduce some of these symptoms and risks. Talk to your health care provider about whether hormone replacement therapy is right for you.  HOME CARE INSTRUCTIONS   Schedule regular health, dental, and eye exams.  Stay current with your immunizations.   Do not use any tobacco products including cigarettes, chewing tobacco, or electronic cigarettes.  If you are pregnant, do not drink alcohol.  If you are breastfeeding, limit how much and how often you drink alcohol.  Limit alcohol intake to no more than 1 drink per day for nonpregnant women. One drink equals 12 ounces of beer, 5 ounces of wine, or 1 ounces of hard liquor.  Do not use street drugs.  Do not share needles.  Ask your health care provider for help if you need support or information about quitting drugs.  Tell your health care provider if you  often feel depressed.  Tell your health care provider if you have ever been abused or do not feel safe at home.   This information is not intended to replace advice given to you by your health care provider. Make sure you discuss any questions you have with your health care provider.   Document Released: 02/11/2011 Document Revised: 08/19/2014 Document Reviewed: 06/30/2013 Elsevier Interactive Patient Education [employer].

## 2016-04-08 DIAGNOSIS — N939 Abnormal uterine and vaginal bleeding, unspecified: Secondary | ICD-10-CM | POA: Diagnosis not present

## 2016-04-08 DIAGNOSIS — D259 Leiomyoma of uterus, unspecified: Secondary | ICD-10-CM | POA: Diagnosis not present

## 2016-04-08 DIAGNOSIS — N83202 Unspecified ovarian cyst, left side: Secondary | ICD-10-CM | POA: Diagnosis not present

## 2016-05-08 MED FILL — LEVETIRACETAM ER 500 MG TAB: 500 | 30 days supply | Qty: 60 | Fill #8

## 2016-05-26 NOTE — Progress Notes (Signed)
[name] [place], [place] [address] Phone: [phone] Subjective:    I'm seeing this patient by the request  of:  Hoyt Koch, MD  CC: Left hip pain   [id number]  [name] is a 45 y.o. female coming in with complaint of left hip pain.  Patient states that this isn't going on for 3 months. Had been doing a lot more hiking during the summer. States that that seems to exacerbate it. Going up and down hills, stairs or standing for long amount of time. Her pain more on the lateral aspect of the hip that seems to radiate a little anteriorly. Has had some mild radiation going down the leg. Denies any numbness or weakness. States that it is a chronic dull throbbing aching sensation at all times with sometimes a sharp pain with certain movements. Denies any waking up at night. Patient states that there is some mild back pain but very minimal. Has done over-the-counter anti-inflammatories which were very minimally beneficial.     Past Medical History:  Diagnosis Date  . Seizure ([place])    No past surgical history on file. Social History   Social History  . Marital status: Married    Spouse name: N/A  . Number of children: 2  . Years of education: N/A   Occupational History  .  [employer]   Social History Main Topics  . Smoking status: Never Smoker  . Smokeless tobacco: Never Used  . Alcohol use No  . Drug use: No  . Sexual activity: Not Asked   Other Topics Concern  . None   Social History Narrative   Patient is married and has 2 children.          No Known Allergies Family History  Problem Relation Age of Onset  . Diabetes Father   . Anemia Father   . Seizures Neg Hx     Past medical history, social, surgical and family history all reviewed in electronic medical record.  No pertanent information unless stated regarding to the chief complaint.   Review of Systems: No headache, visual changes, nausea,  vomiting, diarrhea, constipation, dizziness, abdominal pain, skin rash, fevers, chills, night sweats, weight loss, swollen lymph nodes, body aches, joint swelling, muscle aches, chest pain, shortness of breath, mood changes.   Objective  Blood pressure 122/74, pulse 92, SpO2 99 %.  General: No apparent distress alert and oriented x3 mood and affect normal, dressed appropriately.  HEENT: Pupils equal, extraocular movements intact  Respiratory: Patient's speak in full sentences and does not appear short of breath  Cardiovascular: No lower extremity edema, non tender, no erythema  Skin: Warm dry intact with no signs of infection or rash on extremities or on axial skeleton.  Abdomen: Soft nontender  Neuro: Cranial nerves II through XII are intact, neurovascularly intact in all extremities with 2+ DTRs and 2+ pulses.  Lymph: No lymphadenopathy of posterior or anterior cervical chain or axillae bilaterally.  Gait normal with good balance and coordination.  MSK:  Non tender with full range of motion and good stability and symmetric strength and tone of shoulders, elbows, wrist,  knee and ankles bilaterally.  Hip: Left ROM IR: 15 Deg, ER: 50 Deg with some pain, Flexion: 100 Deg, Extension: 100 Deg, Abduction: 45 Deg, Adduction: 45 Deg Strength IR: 5/5, ER: 5/5, Flexion: 5/5, Extension: 5/5, Abduction: 5/5, Adduction: 5/5 Pelvic alignment unremarkable to inspection and palpation. Standing hip rotation and gait without  trendelenburg sign / unsteadiness. Greater trochanter severe tenderness over the greater trochanter area No tenderness over piriformis  Pain with Corky Sox Negative straight leg test today No SI joint tenderness and normal minimal SI movement. Contralateral hip unremarkable  MSK US performed of: Left This study was ordered, performed, and interpreted by Charlann Boxer D.O.  Hip: Trochanteric bursa with significant hypoechoic changes and swelling Acetabular labrum visualized and without  tears, displacement, or effusion in joint. Femoral neck appears unremarkable without increased power doppler signal along Cortex.  IMPRESSION:  Greater trochanter bursitis   Procedure: Real-time Ultrasound Guided Injection of left greater trochanteric bursitis secondary to patient's body habitus Device: GE Logiq E  Ultrasound guided injection is preferred based studies that show increased duration, increased effect, greater accuracy, decreased procedural pain, increased response rate, and decreased cost with ultrasound guided versus blind injection.  Verbal informed consent obtained.  Time-out conducted.  Noted no overlying erythema, induration, or other signs of local infection.  Skin prepped in a sterile fashion.  Local anesthesia: Topical Ethyl chloride.  With sterile technique and under real time ultrasound guidance:  Greater trochanteric area was visualized and patient's bursa was noted. A 22-gauge 3 inch needle was inserted and 4 cc of 0.5% Marcaine and 1 cc of Kenalog 40 mg/dL was injected. Pictures taken Completed without difficulty  Pain immediately resolved suggesting accurate placement of the medication.  Advised to call if fevers/chills, erythema, induration, drainage, or persistent bleeding.  Images permanently stored and available for review in the ultrasound unit.  Impression: Technically successful ultrasound guided injection.   Procedure note [id number]; 15 minutes spent for Therapeutic exercises as stated in above notes.  This included exercises focusing on stretching, strengthening, with significant focus on eccentric aspects. Hip strengthening exercises which included:  Pelvic tilt/bracing to help with proper recruitment of the lower abs and pelvic floor muscles  Glute strengthening to properly contract glutes without over-engaging low back and hamstrings - prone hip extension and glute bridge exercises Proper stretching techniques to increase effectiveness for the hip  flexors, groin, quads, piriformic and low back when appropriate     Proper technique shown and discussed handout in great detail with ATC.  All questions were discussed and answered.     Impression and Recommendations:     This case required medical decision making of moderate complexity.      Note: This dictation was prepared with Dragon dictation along with smaller phrase technology. Any transcriptional errors that result from this process are unintentional.

## 2016-05-27 ENCOUNTER — Ambulatory Visit (INDEPENDENT_AMBULATORY_CARE_PROVIDER_SITE_OTHER): Payer: 59 | Admitting: Family Medicine

## 2016-05-27 ENCOUNTER — Encounter: Payer: Self-pay | Admitting: Family Medicine

## 2016-05-27 DIAGNOSIS — M7062 Trochanteric bursitis, left hip: Secondary | ICD-10-CM

## 2016-05-27 MED ORDER — GABAPENTIN 100 MG PO CAPS: 200.0000 mg | ORAL_CAPSULE | Freq: Every day | ORAL | 3 refills | Status: DC

## 2016-05-27 MED ORDER — PREDNISONE 50 MG PO TABS: 50.0000 mg | ORAL_TABLET | Freq: Every day | ORAL | 0 refills | Status: DC

## 2016-05-27 MED FILL — predniSONE 50 MG TABS: 50 | 5 days supply | Qty: 5 | Fill #0

## 2016-05-27 NOTE — Assessment & Plan Note (Signed)
Given an injection today. Tolerated procedure well. We discussed icing regimen and home exercises. Started on gabapentin. Work with Product/process development scientist. Patient given prednisone if pain is adequately resolved in the next 48 hours. Continued have pain at follow-up we'll need to consider further workup for her back.

## 2016-05-27 NOTE — Patient Instructions (Addendum)
Good to see you.  Ice 20 minutes 2 times daily. Usually after activity and before bed. Exercises 3 times a week.  [employer] orthotics "total support" online would be great  If not better in 2 days take prednisone daily for 5 days Gabapentin 200mg  nightly  See me again in 3-4 weeks to make sure completely gone.  Also call me and if not better I want xray of your back and hip

## 2016-06-05 DIAGNOSIS — H524 Presbyopia: Secondary | ICD-10-CM | POA: Diagnosis not present

## 2016-06-06 MED FILL — LEVETIRACETAM ER 500 MG TAB: 500 | 30 days supply | Qty: 60 | Fill #9

## 2016-06-14 ENCOUNTER — Ambulatory Visit (INDEPENDENT_AMBULATORY_CARE_PROVIDER_SITE_OTHER): Payer: 59 | Admitting: Neurology

## 2016-06-14 ENCOUNTER — Encounter: Payer: Self-pay | Admitting: Neurology

## 2016-06-14 VITALS — BP 107/73 | HR 80 | Ht 63.0 in | Wt 138.5 lb

## 2016-06-14 DIAGNOSIS — G40309 Generalized idiopathic epilepsy and epileptic syndromes, not intractable, without status epilepticus: Secondary | ICD-10-CM | POA: Diagnosis not present

## 2016-06-14 MED ORDER — LEVETIRACETAM ER 500 MG PO TB24: ORAL_TABLET | ORAL | 3 refills | Status: DC

## 2016-06-14 NOTE — Progress Notes (Signed)
    Reason for visit: Seizures  Lori [name] is an 45 y.o. female  History of present illness:  Lori [name] is a 45 year old right-handed [place] female with a history of seizures. The patient has done quite well on Keppra, she is on the extended release preparation taking 1000 mg daily. The patient has not had any seizures since last seen. She denies any other significant medical issues that have come up since last seen. The patient is operating a motor vehicle. She is tolerating the medication well. She returns for an annual evaluation.  Past Medical History:  Diagnosis Date  . Seizure [facility])     History reviewed. No pertinent surgical history.  Family History  Problem Relation Age of Onset  . Diabetes Father   . Anemia Father   . Seizures Neg Hx     Social history:  reports that she has never smoked. She has never used smokeless tobacco. She reports that she does not drink alcohol or use drugs.   No Known Allergies  Medications:  Prior to Admission medications   Medication Sig Start Date End Date Taking? Authorizing Provider  cholecalciferol (VITAMIN D) 1000 UNITS tablet Take 1,000 Units by mouth daily.   Yes Historical Provider, MD  Cyanocobalamin (VITAMIN B 12 PO) Take 1 tablet by mouth daily.   Yes Historical Provider, MD  fluticasone (FLONASE) 50 MCG/ACT nasal spray Place 2 sprays into both nostrils daily. 04/05/16  Yes Hoyt Koch, MD  levETIRAcetam (KEPPRA XR) 500 MG 24 hr tablet Take 2 every morning 06/14/16  Yes Kathrynn Ducking, MD  Multiple Vitamin (MULTIVITAMIN) tablet Take 1 tablet by mouth daily.   Yes Historical Provider, MD    ROS:  Out of a complete 14 system review of symptoms, the patient complains only of the following symptoms, and all other reviewed systems are negative.  Seizures  Blood pressure 107/73, pulse 80, height 5\' 3"  (1.6 m), weight 138 lb 8 oz (62.8 kg).  Physical Exam  General: The patient is alert and cooperative at the time  of the examination.  Skin: No significant peripheral edema is noted.   Neurologic Exam  Mental status: The patient is alert and oriented x 3 at the time of the examination. The patient has apparent normal recent and remote memory, with an apparently normal attention span and concentration ability.   Cranial nerves: Facial symmetry is present. Speech is normal, no aphasia or dysarthria is noted. Extraocular movements are full. Visual fields are full.  Motor: The patient has good strength in all 4 extremities.  Sensory examination: Soft touch sensation is symmetric on the face, arms, and legs.  Coordination: The patient has good finger-nose-finger and heel-to-shin bilaterally.  Gait and station: The patient has a normal gait. Tandem gait is normal. Romberg is negative. No drift is seen.  Reflexes: Deep tendon reflexes are symmetric.   Assessment/Plan:  1. History seizures, well controlled  The patient will continue the Keppra, a prescription was called in. The patient will follow-up through this office in one year, sooner if needed.  Jill Alexanders MD 06/14/2016 8:12 AM  [facility] [address] [place], [place] [address]  Phone [phone] Fax [phone]

## 2016-06-21 ENCOUNTER — Encounter: Payer: Self-pay | Admitting: Family Medicine

## 2016-06-24 NOTE — Progress Notes (Signed)
[name] [place], [place] [address] Phone: [phone] Subjective:    I'm seeing this patient by the request  of:  Hoyt Koch, MD  CC: Left hip pain follow-up  [id number]  Lori [name] is a 45 y.o. female coming in with complaint of left hip pain. atient was found to have a greater trochanter bursitis. Was given injection 1 month ago. Patient was to doicing regimen,topical anti-inflammatories, and was given an injection. Patient states ip pain is minimally better. Feels than she was doing much better when she was on the prednisone and with the injection. Patient states that she has not a she's been having some increasing back pain. States that when she flexed she starts having some mild radiation going down the leg as well as towards the hip. Patient has the house. Still has pain  sits for a long amount of time.     Past Medical History:  Diagnosis Date  . Seizure ([place])    No past surgical history on file. Social History   Social History  . Marital status: Married    Spouse name: N/A  . Number of children: 2  . Years of education: N/A   Occupational History  .  [employer]   Social History Main Topics  . Smoking status: Never Smoker  . Smokeless tobacco: Never Used  . Alcohol use No  . Drug use: No  . Sexual activity: Not Asked   Other Topics Concern  . None   Social History Narrative   Patient is married and has 2 children.    Right-handed   Caffeine: 1 1/2 cups of tea per day      No Known Allergies Family History  Problem Relation Age of Onset  . Diabetes Father   . Anemia Father   . Seizures Neg Hx     Past medical history, social, surgical and family history all reviewed in electronic medical record.  No pertanent information unless stated regarding to the chief complaint.   Review of Systems: No headache, visual changes, nausea, vomiting, diarrhea, constipation, dizziness, abdominal pain,  skin rash, fevers, chills, night sweats, weight loss, swollen lymph nodes, body aches, joint swelling, muscle aches, chest pain, shortness of breath, mood changes.   Objective  Blood pressure 118/74, pulse 74, height 5\' 3"  (1.6 m), weight 140 lb (63.5 kg), SpO2 97 %.  Systems examined below as of 06/25/16 General: NAD A&O x3 mood, affect normal  HEENT: Pupils equal, extraocular movements intact no nystagmus Respiratory: not short of breath at rest or with speaking Cardiovascular: No lower extremity edema, non tender Skin: Warm dry intact with no signs of infection or rash on extremities or on axial skeleton. Abdomen: Soft nontender, no masses Neuro: Cranial nerves  intact, neurovascularly intact in all extremities with 2+ DTRs and 2+ pulses. Lymph: No lymphadenopathy appreciated today  Gait normal with good balance and coordination.  MSK: Non tender with full range of motion and good stability and symmetric strength and tone of shoulders, elbows, wrist,  knee and ankles bilaterally.  .  Hip: Left ROM IR: 15 Deg, ER: 85 Deg with some pain, Flexion: 100 Deg, Extension: 100 Deg, Abduction: 45 Deg, Adduction: 45 Deg Strength IR: 5/5, ER: 5/5, Flexion: 5/5, Extension: 5/5, Abduction: 5/5, Adduction: 5/5 Pelvic alignment unremarkable to inspection and palpation. Standing hip rotation and gait without trendelenburg sign / unsteadiness. Greater trochanter mild pain  Mild tenderness over piriformis  Pain with Corky Sox  on left  Negative straight leg test today Mild pain on the left  Contralateral hip unremarkable Neg SLT    osteopathic findings t3 extended rotated and side bent right t5 extended rotated and side bent left L2 flexed rotated inside bent left  Sacrum left on left    Impression and Recommendations:     This case required medical decision making of moderate complexity.      Note: This dictation was prepared with Dragon dictation along with smaller phrase technology.  Any transcriptional errors that result from this process are unintentional.

## 2016-06-25 ENCOUNTER — Ambulatory Visit (INDEPENDENT_AMBULATORY_CARE_PROVIDER_SITE_OTHER): Payer: 59 | Admitting: Family Medicine

## 2016-06-25 ENCOUNTER — Encounter: Payer: Self-pay | Admitting: Family Medicine

## 2016-06-25 DIAGNOSIS — M999 Biomechanical lesion, unspecified: Secondary | ICD-10-CM

## 2016-06-25 DIAGNOSIS — M5416 Radiculopathy, lumbar region: Secondary | ICD-10-CM

## 2016-06-25 MED ORDER — GABAPENTIN 100 MG PO CAPS: 200.0000 mg | ORAL_CAPSULE | Freq: Every day | ORAL | 3 refills | Status: DC

## 2016-06-25 MED FILL — GABAPENTIN 100 MG CAPSULE: 100 | 30 days supply | Qty: 60 | Fill #0

## 2016-06-25 NOTE — Assessment & Plan Note (Signed)
Decision today to treat with OMT was based on Physical Exam  After verbal consent patient was treated with HVLA, ME techniques in thoracic, lumbar and sacral areas  Patient tolerated the procedure well with improvement in symptoms  Patient given exercises, stretches and lifestyle modifications  See medications in patient instructions if given  Patient will follow up in 4-6 weeks          

## 2016-06-25 NOTE — Patient Instructions (Signed)
God to see you  I think this is your back.  Try gabapentin 100-200mg  at night, I think it will help a lot Try the newere exercises 3 times a week for your back  Ice is still good  See me again in 4-6 weeks if the manipulation helps we may continue that regularly.  Thank you again for your help yesterday.

## 2016-06-25 NOTE — Assessment & Plan Note (Signed)
Patient seems to be doing relatively well. Patient encouraged to do more of the exercises. Patient work with Product/process development scientist. Did respond well to osteopathic manipulation. Encourage her to take the gabapentin on a regular basis which she has not been doing. Follow-up again in 4 weeks

## 2016-06-28 ENCOUNTER — Encounter: Payer: Self-pay | Admitting: Family Medicine

## 2016-07-08 MED FILL — LEVETIRACETAM ER 500 MG TAB: 500 | 90 days supply | Qty: 180 | Fill #0

## 2016-07-23 DIAGNOSIS — D259 Leiomyoma of uterus, unspecified: Secondary | ICD-10-CM | POA: Diagnosis not present

## 2016-07-23 DIAGNOSIS — Z30431 Encounter for routine checking of intrauterine contraceptive device: Secondary | ICD-10-CM | POA: Diagnosis not present

## 2016-07-23 DIAGNOSIS — N939 Abnormal uterine and vaginal bleeding, unspecified: Secondary | ICD-10-CM | POA: Diagnosis not present

## 2016-07-25 MED FILL — ESTRADIOL 2 MG TABLET: 2 | 30 days supply | Qty: 30 | Fill #0

## 2016-07-30 ENCOUNTER — Ambulatory Visit: Payer: 59 | Admitting: Family Medicine

## 2016-08-30 ENCOUNTER — Encounter: Payer: Self-pay | Admitting: Internal Medicine

## 2016-08-30 ENCOUNTER — Ambulatory Visit (INDEPENDENT_AMBULATORY_CARE_PROVIDER_SITE_OTHER): Payer: 59 | Admitting: Internal Medicine

## 2016-08-30 DIAGNOSIS — J069 Acute upper respiratory infection, unspecified: Secondary | ICD-10-CM | POA: Diagnosis not present

## 2016-08-30 DIAGNOSIS — B9789 Other viral agents as the cause of diseases classified elsewhere: Secondary | ICD-10-CM

## 2016-08-30 MED ORDER — BENZONATATE 200 MG PO CAPS: 200.0000 mg | ORAL_CAPSULE | Freq: Two times a day (BID) | ORAL | 0 refills | Status: DC | PRN

## 2016-08-30 MED FILL — BENZONATATE 200 MG CAP: 200 | 30 days supply | Qty: 60 | Fill #0

## 2016-08-30 NOTE — Progress Notes (Signed)
   Subjective:    Patient ID: Lori [name], female    DOB: August 18, 1970, 46 y.o.   MRN: [id number]  HPI The patient is a 46 YO female coming in for cold symptoms. Going on for about 2 weeks now. Has tried OTC cold medications which were partially helpful. She is having cough, and congestion. The cough is not productive. Worst of symptoms were about 1 week ago. She is having less congestion now and no fevers or chills. Some fatigue still and low energy levels. She is about 50% better from the worst of it. Around sick contacts. Had flu shot.   Review of Systems  Constitutional: Positive for activity change, appetite change and fatigue. Negative for chills, fever and unexpected weight change.  HENT: Positive for congestion and sinus pressure. Negative for ear discharge, ear pain, nosebleeds, postnasal drip, rhinorrhea, sinus pain, sore throat and trouble swallowing.   Eyes: Negative.   Respiratory: Positive for cough. Negative for apnea, chest tightness, shortness of breath and wheezing.   Cardiovascular: Negative.   Gastrointestinal: Negative.   Musculoskeletal: Negative.       Objective:   Physical Exam  Constitutional: She is oriented to person, place, and time. She appears well-developed and well-nourished. No distress.  HENT:  Head: Normocephalic and atraumatic.  Right Ear: External ear normal.  Left Ear: External ear normal.  Oropharynx with redness and clear drainage, nose without crusting, no sinus pressure.   Eyes: EOM are normal.  Neck: Normal range of motion.  Cardiovascular: Normal rate and regular rhythm.   Pulmonary/Chest: Effort normal and breath sounds normal. No respiratory distress. She has no wheezes. She has no rales.  Abdominal: Soft.  Lymphadenopathy:    She has no cervical adenopathy.  Neurological: She is alert and oriented to person, place, and time. Coordination normal.  Skin: Skin is warm and dry.   Vitals:   08/30/16 1311  BP: 128/64  Pulse: 79  Resp:  16  Temp: 98.1 F (36.7 C)  TempSrc: Oral  SpO2: 100%  Weight: 142 lb (64.4 kg)  Height: 5\' 3"  (1.6 m)      Assessment & Plan:

## 2016-08-30 NOTE — Patient Instructions (Signed)
We have sent in the cough medicine called tessalon perles. These can be taken 1 pill three times a day as needed.    Upper Respiratory Infection, Adult Most upper respiratory infections (URIs) are a viral infection of the air passages leading to the lungs. A URI affects the nose, throat, and upper air passages. The most common type of URI is nasopharyngitis and is typically referred to as "the common cold." URIs run their course and usually go away on their own. Most of the time, a URI does not require medical attention, but sometimes a bacterial infection in the upper airways can follow a viral infection. This is called a secondary infection. Sinus and middle ear infections are common types of secondary upper respiratory infections. Bacterial pneumonia can also complicate a URI. A URI can worsen asthma and chronic obstructive pulmonary disease (COPD). Sometimes, these complications can require emergency medical care and may be life threatening. What are the causes? Almost all URIs are caused by viruses. A virus is a type of germ and can spread from one person to another. What increases the risk? You may be at risk for a URI if:  You smoke.  You have chronic heart or lung disease.  You have a weakened defense (immune) system.  You are very young or very old.  You have nasal allergies or asthma.  You work in crowded or poorly ventilated areas.  You work in health care facilities or schools. What are the signs or symptoms? Symptoms typically develop 2-3 days after you come in contact with a cold virus. Most viral URIs last 7-10 days. However, viral URIs from the influenza virus (flu virus) can last 14-18 days and are typically more severe. Symptoms may include:  Runny or stuffy (congested) nose.  Sneezing.  Cough.  Sore throat.  Headache.  Fatigue.  Fever.  Loss of appetite.  Pain in your forehead, behind your eyes, and over your cheekbones (sinus pain).  Muscle  aches. How is this diagnosed? Your health care provider may diagnose a URI by:  Physical exam.  Tests to check that your symptoms are not due to another condition such as:  Strep throat.  Sinusitis.  Pneumonia.  Asthma. How is this treated? A URI goes away on its own with time. It cannot be cured with medicines, but medicines may be prescribed or recommended to relieve symptoms. Medicines may help:  Reduce your fever.  Reduce your cough.  Relieve nasal congestion. Follow these instructions at home:  Take medicines only as directed by your health care provider.  Gargle warm saltwater or take cough drops to comfort your throat as directed by your health care provider.  Use a warm mist humidifier or inhale steam from a shower to increase air moisture. This may make it easier to breathe.  Drink enough fluid to keep your urine clear or pale yellow.  Eat soups and other clear broths and maintain good nutrition.  Rest as needed.  Return to work when your temperature has returned to normal or as your health care provider advises. You may need to stay home longer to avoid infecting others. You can also use a face mask and careful hand washing to prevent spread of the virus.  Increase the usage of your inhaler if you have asthma.  Do not use any tobacco products, including cigarettes, chewing tobacco, or electronic cigarettes. If you need help quitting, ask your health care provider. How is this prevented? The best way to protect yourself from getting  a cold is to practice good hygiene.  Avoid oral or hand contact with people with cold symptoms.  Wash your hands often if contact occurs. There is no clear evidence that vitamin C, vitamin E, echinacea, or exercise reduces the chance of developing a cold. However, it is always recommended to get plenty of rest, exercise, and practice good nutrition. Contact a health care provider if:  You are getting worse rather than  better.  Your symptoms are not controlled by medicine.  You have chills.  You have worsening shortness of breath.  You have brown or red mucus.  You have yellow or brown nasal discharge.  You have pain in your face, especially when you bend forward.  You have a fever.  You have swollen neck glands.  You have pain while swallowing.  You have white areas in the back of your throat. Get help right away if:  You have severe or persistent:  Headache.  Ear pain.  Sinus pain.  Chest pain.  You have chronic lung disease and any of the following:  Wheezing.  Prolonged cough.  Coughing up blood.  A change in your usual mucus.  You have a stiff neck.  You have changes in your:  Vision.  Hearing.  Thinking.  Mood. This information is not intended to replace advice given to you by your health care provider. Make sure you discuss any questions you have with your health care provider. Document Released: 01/22/2001 Document Revised: 03/31/2016 Document Reviewed: 11/03/2013 Elsevier Interactive Patient Education  2017 [employer].

## 2016-08-30 NOTE — Progress Notes (Signed)
Pre visit review using our clinic review tool, if applicable. No additional management support is needed unless otherwise documented below in the visit note. 

## 2016-08-30 NOTE — Assessment & Plan Note (Signed)
Rx for tessalon perles up to TID prn. No indication for steroids or antibiotics. Reassurance given and time course discussed. Return if needed.

## 2016-10-02 MED FILL — LEVETIRACETAM ER 500 MG TAB: 500 | 90 days supply | Qty: 180 | Fill #1

## 2016-11-01 ENCOUNTER — Encounter: Payer: Self-pay | Admitting: Internal Medicine

## 2016-11-01 ENCOUNTER — Ambulatory Visit (INDEPENDENT_AMBULATORY_CARE_PROVIDER_SITE_OTHER): Payer: 59 | Admitting: Internal Medicine

## 2016-11-01 VITALS — BP 118/76 | HR 63 | Temp 98.2°F | Wt 136.0 lb

## 2016-11-01 DIAGNOSIS — J309 Allergic rhinitis, unspecified: Secondary | ICD-10-CM | POA: Diagnosis not present

## 2016-11-01 DIAGNOSIS — H6593 Unspecified nonsuppurative otitis media, bilateral: Secondary | ICD-10-CM | POA: Diagnosis not present

## 2016-11-01 DIAGNOSIS — H6991 Unspecified Eustachian tube disorder, right ear: Secondary | ICD-10-CM | POA: Diagnosis not present

## 2016-11-01 MED ORDER — PSEUDOEPHEDRINE-GUAIFENESIN ER 60-600 MG PO TB12: 1.0000 | ORAL_TABLET | Freq: Two times a day (BID) | ORAL | 0 refills | Status: DC | PRN

## 2016-11-01 MED ORDER — LEVOFLOXACIN 500 MG PO TABS: 500.0000 mg | ORAL_TABLET | Freq: Every day | ORAL | 0 refills | Status: AC

## 2016-11-01 MED ORDER — FLUTICASONE PROPIONATE 50 MCG/ACT NA SUSP: 2.0000 | Freq: Every day | NASAL | 6 refills | Status: DC

## 2016-11-01 MED FILL — ESTRADIOL 2 MG TABLET: 2 | 30 days supply | Qty: 30 | Fill #1

## 2016-11-01 MED FILL — levoFLOXacin 500 MG TABS: 500 | 10 days supply | Qty: 10 | Fill #0

## 2016-11-01 NOTE — Progress Notes (Signed)
   Subjective:    Patient ID: Lori [name], female    DOB: 03/04/1971, 46 y.o.   MRN: [id number]  HPI   Here with 2-3 days acute onset fever, bilat ear pain, pressure, headache, general weakness and malaise, without cough, but pt denies chest pain, wheezing, increased sob or doe, orthopnea, PND, increased LE swelling, palpitations, dizziness or syncope.  Has had some bilat ear popping, crackling with some discomfort below the ears in the neck.  Does have several mo ongoing mild nasal allergy symptoms with clearish congestion, itch and sneezing, but actually less recently Past Medical History:  Diagnosis Date  . Seizure ([place])    No past surgical history on file.  reports that she has never smoked. She has never used smokeless tobacco. She reports that she does not drink alcohol or use drugs. family history includes Anemia in her father; Diabetes in her father. No Known Allergies Current Outpatient Prescriptions on File Prior to Visit  Medication Sig Dispense Refill  . cholecalciferol (VITAMIN D) 1000 UNITS tablet Take 1,000 Units by mouth daily.    . Cyanocobalamin (VITAMIN B 12 PO) Take 1 tablet by mouth daily.    Marland Kitchen gabapentin (NEURONTIN) 100 MG capsule Take 2 capsules (200 mg total) by mouth at bedtime. 60 capsule 3  . levETIRAcetam (KEPPRA XR) 500 MG 24 hr tablet Take 2 every morning 180 tablet 3  . Multiple Vitamin (MULTIVITAMIN) tablet Take 1 tablet by mouth daily.     No current facility-administered medications on file prior to visit.    Review of Systems  Constitutional: Negative for unusual diaphoresis or night sweats HENT: Negative for ear swelling or discharge Eyes: Negative for worsening visual haziness  Respiratory: Negative for choking and stridor.   Gastrointestinal: Negative for distension or worsening eructation Genitourinary: Negative for retention or change in urine volume.  Musculoskeletal: Negative for other MSK pain or swelling Skin: Negative for color change  and worsening wound Neurological: Negative for tremors and numbness other than noted  Psychiatric/Behavioral: Negative for decreased concentration or agitation other than above   All other system neg per pt    Objective:   Physical Exam BP 118/76   Pulse 63   Temp 98.2 F (36.8 C) (Oral)   Wt 136 lb (61.7 kg)   SpO2 99%   BMI 24.09 kg/m  VS noted,  Constitutional: Pt appears in no apparent distress HENT: Head: NCAT.  Right Ear: External ear normal.  Left Ear: External ear normal.  Eyes: . Pupils are equal, round, and reactive to light. Conjunctivae and EOM are normal Bilat tm's with mild erythema.  Max sinus areas mild tender.  Pharynx with mild erythema, no exudate Neck: Normal range of motion. Neck supple.  Cardiovascular: Normal rate and regular rhythm.   Pulmonary/Chest: Effort normal and breath sounds without rales or wheezing.  Neurological: Pt is alert. Not confused , motor grossly intact Skin: Skin is warm. No rash, no LE edema Psychiatric: Pt behavior is normal. No agitation.  No other exam findings    Assessment & Plan:

## 2016-11-01 NOTE — Progress Notes (Signed)
Pre visit review using our clinic review tool, if applicable. No additional management support is needed unless otherwise documented below in the visit note. 

## 2016-11-01 NOTE — Patient Instructions (Signed)
Please take all new medication as prescribed  - the antibiotic, and the Mucinex D  Please continue all other medications as before, including regular use of the flonase  Please have the pharmacy call with any other refills you may need.  Please keep your appointments with your specialists as you may have planned

## 2016-11-02 NOTE — Assessment & Plan Note (Signed)
Mild to mod, for mucinex otc prn,  to f/u any worsening symptoms or concerns 

## 2016-11-02 NOTE — Assessment & Plan Note (Signed)
Mild to mod, for antibx course,  to f/u any worsening symptoms or concerns 

## 2016-11-02 NOTE — Assessment & Plan Note (Signed)
Stable, to continue flonase asd,  to f/u any worsening symptoms or concerns 

## 2016-11-05 ENCOUNTER — Ambulatory Visit: Payer: 59 | Admitting: Internal Medicine

## 2016-11-29 MED FILL — ESTRADIOL 2 MG TABLET: 2 | 30 days supply | Qty: 30 | Fill #2

## 2016-12-31 MED FILL — LEVETIRACETAM ER 500 MG TAB: 500 | 90 days supply | Qty: 180 | Fill #2

## 2016-12-31 MED FILL — ESTRADIOL 2 MG TABLET: 2 | 30 days supply | Qty: 30 | Fill #3

## 2017-01-01 IMAGING — CR DG THORACIC SPINE 3V
3 series · 3 of 3 positions shown · non-contrast
Comparison: 12/05/2013

CLINICAL DATA: Mid thoracic back pain for 6 months. No known
injury.

EXAM:
THORACIC SPINE - 3 VIEWS

[view not recorded (1 of 3)]
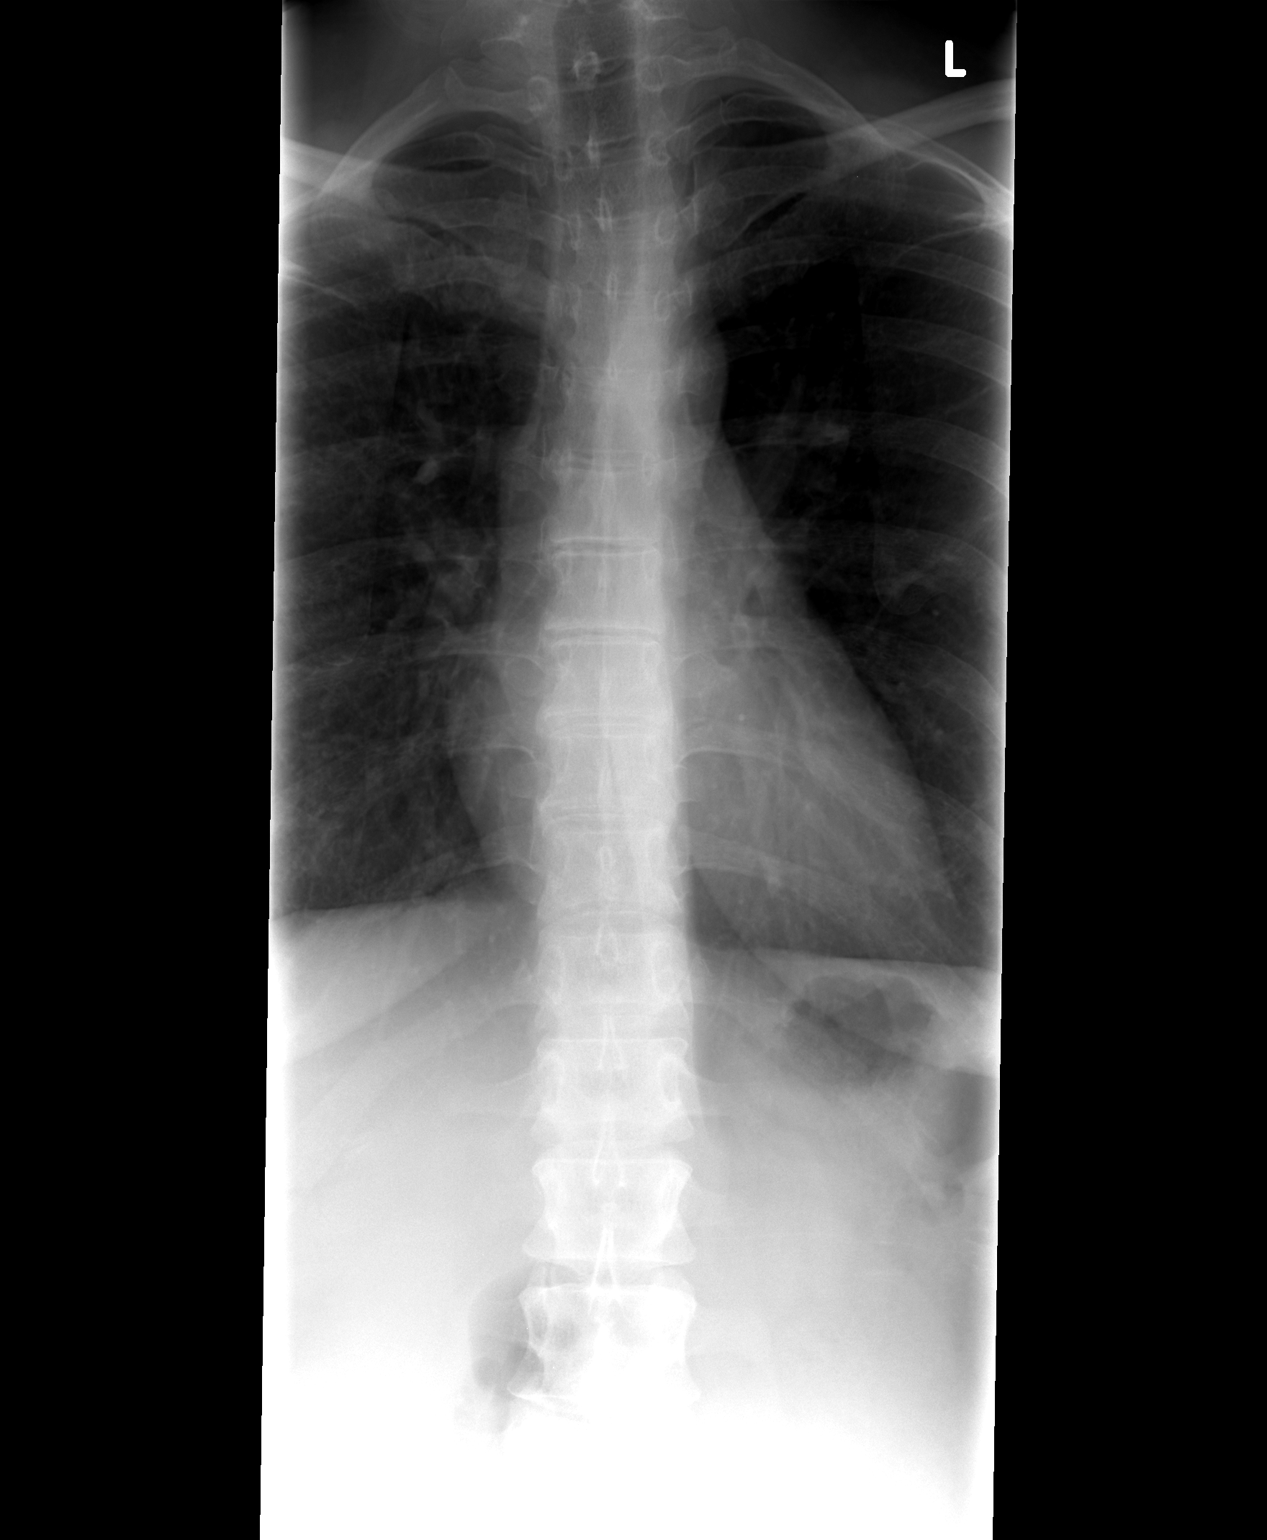

[view not recorded (2 of 3)]
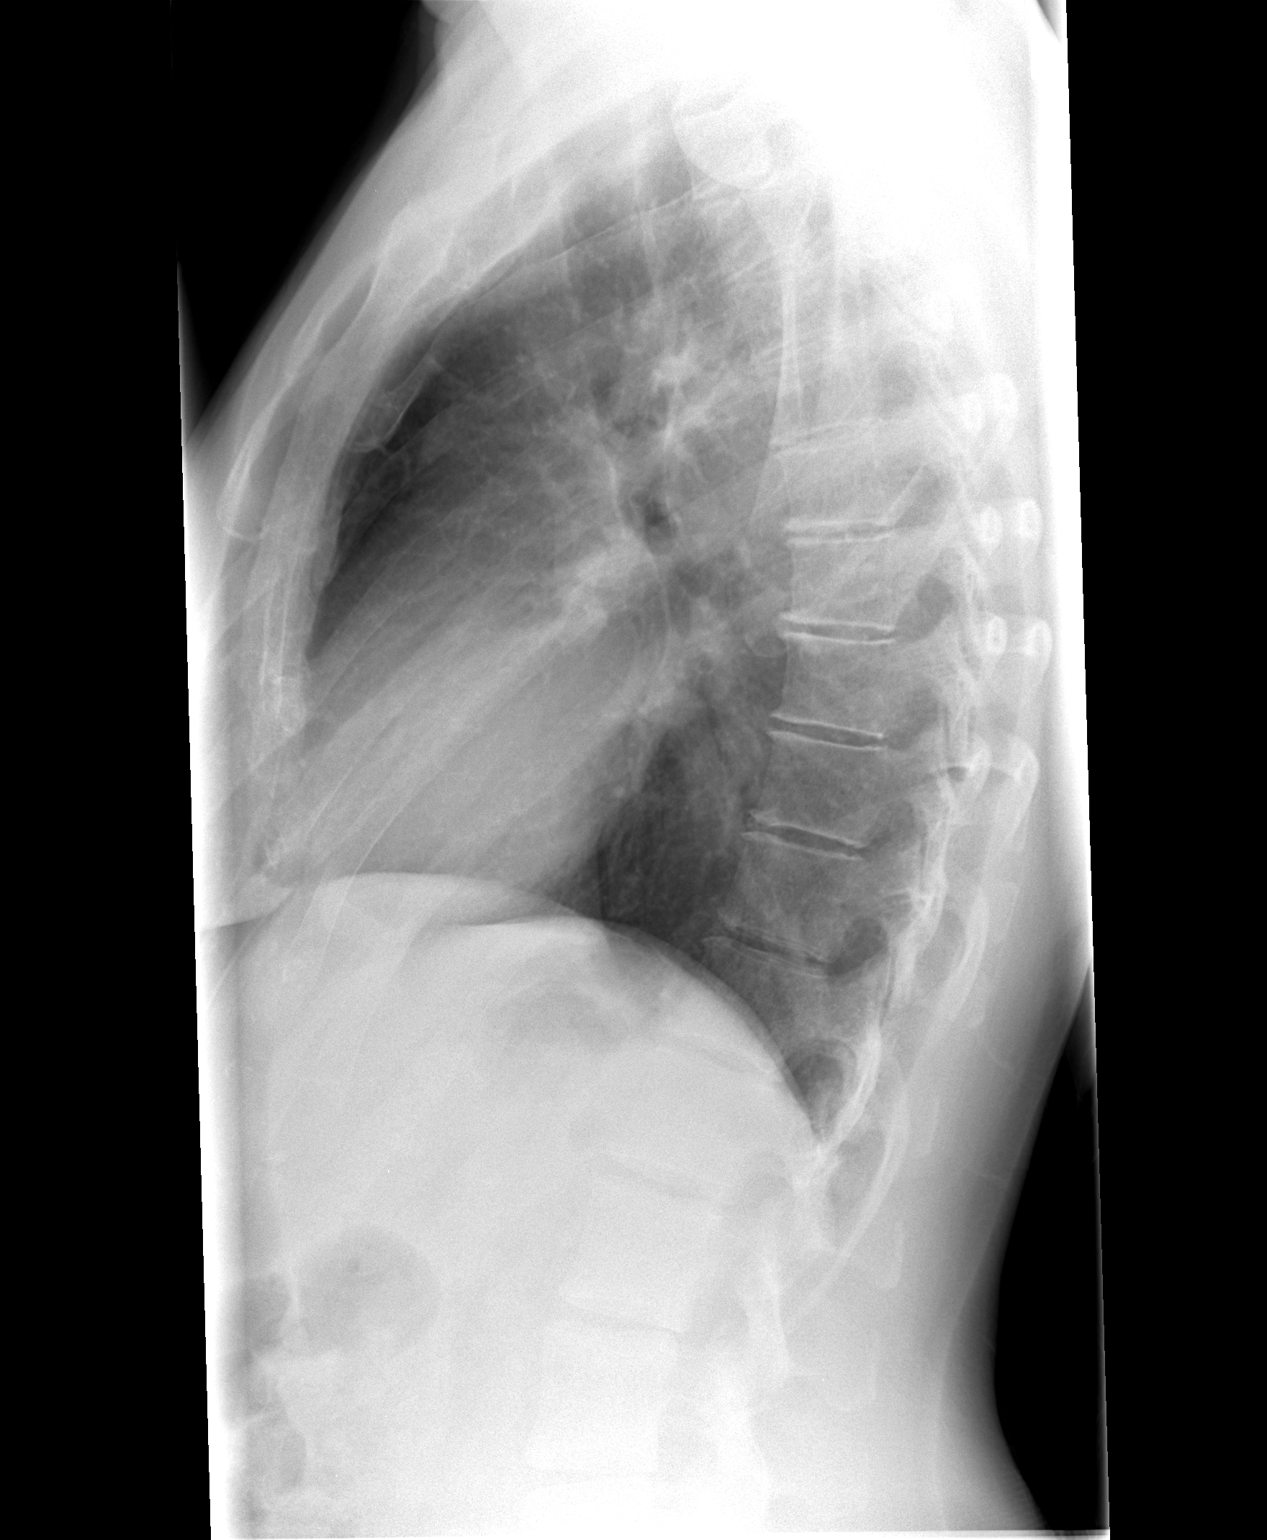

[view not recorded (3 of 3)]
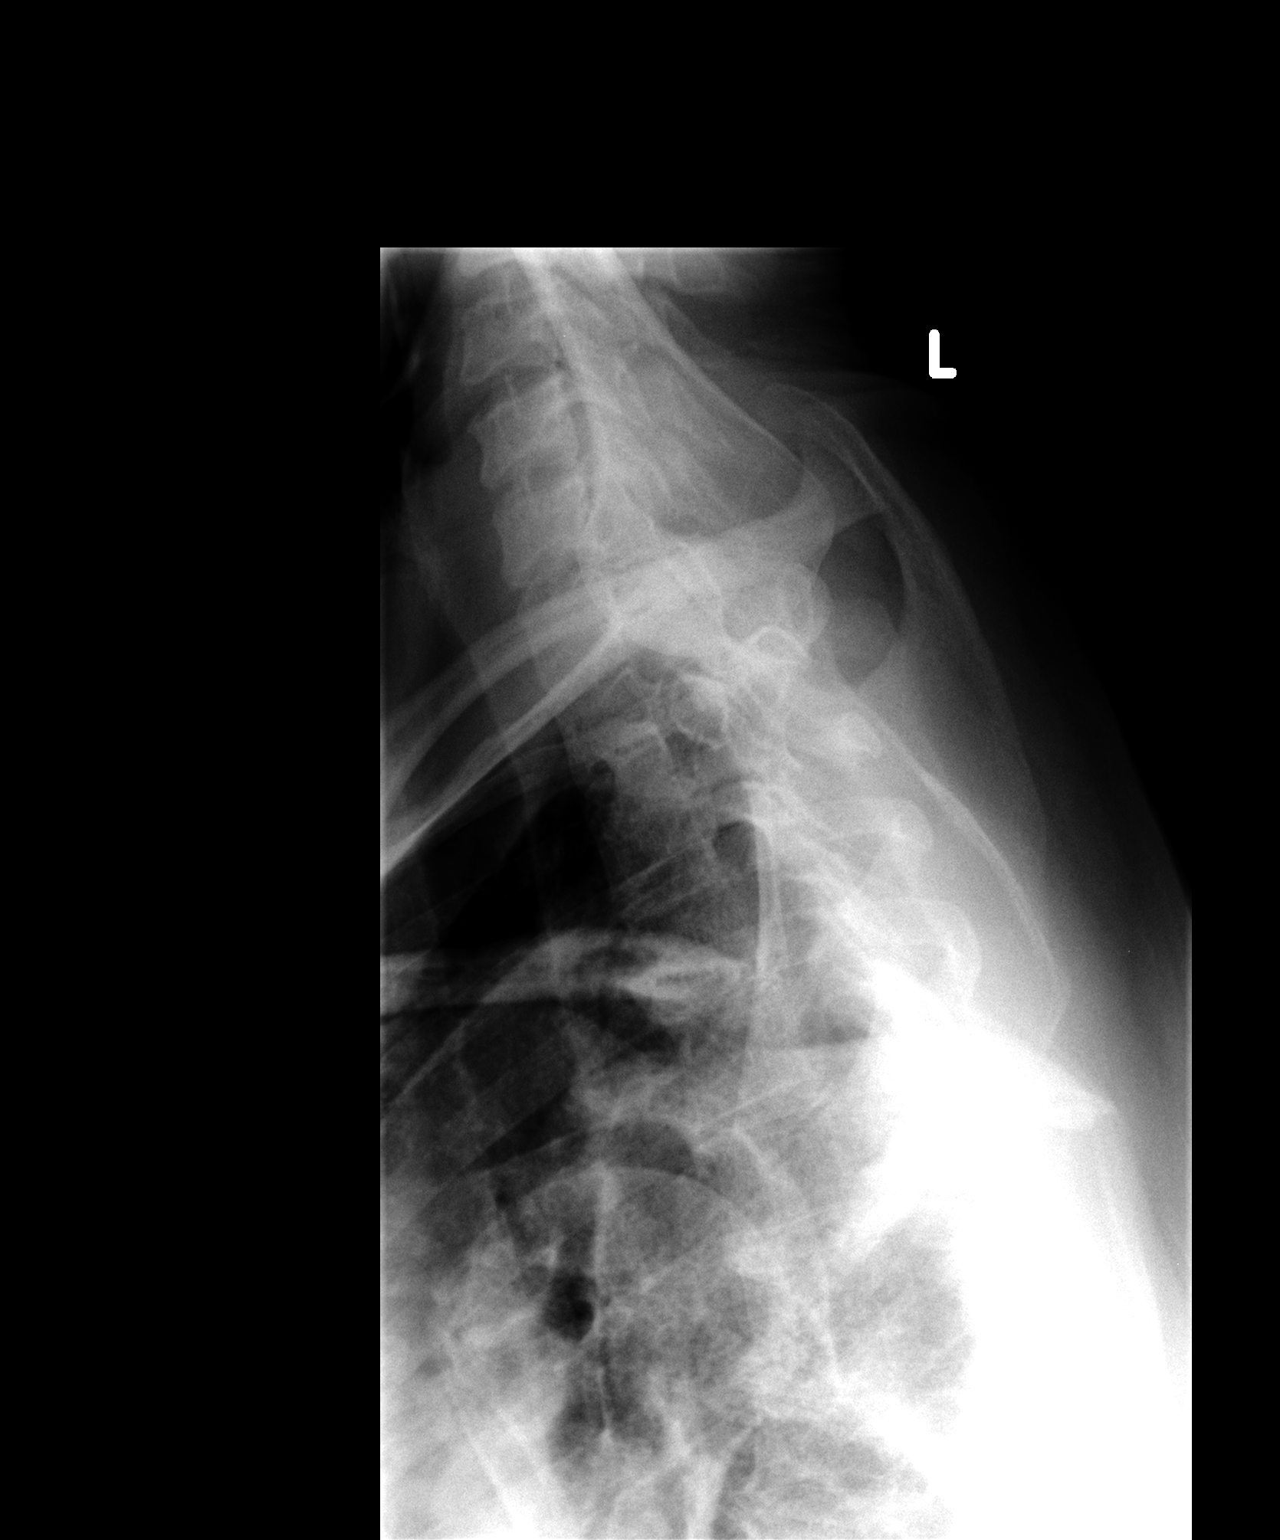

[3 of 3 positions shown; findings below may reference images not displayed]

FINDINGS: There is no evidence of thoracic spine fracture. Alignment is
normal. No other significant bone abnormalities are identified.
IMPRESSION: Negative.

## 2017-02-03 MED FILL — ESTRADIOL 2 MG TABLET: 2 | 30 days supply | Qty: 30 | Fill #4

## 2017-02-28 DIAGNOSIS — Z6824 Body mass index (BMI) 24.0-24.9, adult: Secondary | ICD-10-CM | POA: Diagnosis not present

## 2017-02-28 DIAGNOSIS — Z01419 Encounter for gynecological examination (general) (routine) without abnormal findings: Secondary | ICD-10-CM | POA: Diagnosis not present

## 2017-02-28 DIAGNOSIS — Z1231 Encounter for screening mammogram for malignant neoplasm of breast: Secondary | ICD-10-CM | POA: Diagnosis not present

## 2017-02-28 DIAGNOSIS — N939 Abnormal uterine and vaginal bleeding, unspecified: Secondary | ICD-10-CM | POA: Diagnosis not present

## 2017-03-17 MED FILL — ESTRADIOL 2 MG TABLET: 2 | 30 days supply | Qty: 30 | Fill #5

## 2017-04-07 MED FILL — LEVETIRACETAM ER 500 MG TAB: 500 | 90 days supply | Qty: 180 | Fill #3

## 2017-04-17 MED FILL — ESTRADIOL 2 MG TABLET: 2 | 90 days supply | Qty: 90 | Fill #0

## 2017-05-06 DIAGNOSIS — H524 Presbyopia: Secondary | ICD-10-CM | POA: Diagnosis not present

## 2017-05-26 ENCOUNTER — Encounter: Payer: Self-pay | Admitting: Internal Medicine

## 2017-05-26 ENCOUNTER — Ambulatory Visit (INDEPENDENT_AMBULATORY_CARE_PROVIDER_SITE_OTHER): Payer: 59 | Admitting: Internal Medicine

## 2017-05-26 VITALS — BP 118/70 | HR 75 | Temp 98.4°F | Ht 63.0 in | Wt 137.0 lb

## 2017-05-26 DIAGNOSIS — J302 Other seasonal allergic rhinitis: Secondary | ICD-10-CM | POA: Diagnosis not present

## 2017-05-26 DIAGNOSIS — E061 Subacute thyroiditis: Secondary | ICD-10-CM | POA: Diagnosis not present

## 2017-05-26 DIAGNOSIS — Z0000 Encounter for general adult medical examination without abnormal findings: Secondary | ICD-10-CM | POA: Diagnosis not present

## 2017-05-26 MED ORDER — NEOMYCIN-POLYMYXIN-HC 3.5-10000-1 OT SOLN: 3.0000 [drp] | Freq: Three times a day (TID) | OTIC | 0 refills | Status: DC

## 2017-05-26 MED FILL — NEO/POLYMYXIN/HC EAR SOLN: 3.5-10000-1 | 10 days supply | Qty: 10 | Fill #0

## 2017-05-26 MED FILL — CHLORHEXIDINE 0.12% RINSE: 0.12 | 16 days supply | Qty: 473 | Fill #0

## 2017-05-26 NOTE — Progress Notes (Signed)
   Subjective:    Patient ID: Marnee Spring, female    DOB: April 06, 1971, 46 y.o.   MRN: [id number]  HPI The patient is a 46 YO female coming in for wellness. No new concerns. Some more tired and some allergy problem since flu shot.   PMH, [facility], social history reviewed and updated.   Review of Systems  Constitutional: Negative.   HENT: Negative.   Eyes: Negative.   Respiratory: Negative for cough, chest tightness and shortness of breath.   Cardiovascular: Negative for chest pain, palpitations and leg swelling.  Gastrointestinal: Negative for abdominal distention, abdominal pain, constipation, diarrhea, nausea and vomiting.  Musculoskeletal: Negative.   Skin: Negative.   Neurological: Negative.   Psychiatric/Behavioral: Negative.       Objective:   Physical Exam  Constitutional: She is oriented to person, place, and time. She appears well-developed and well-nourished.  HENT:  Head: Normocephalic and atraumatic.  Eyes: EOM are normal.  Neck: Normal range of motion.  Cardiovascular: Normal rate and regular rhythm.   Pulmonary/Chest: Effort normal and breath sounds normal. No respiratory distress. She has no wheezes. She has no rales.  Abdominal: Soft. Bowel sounds are normal. She exhibits no distension. There is no tenderness. There is no rebound.  Musculoskeletal: She exhibits no edema.  Neurological: She is alert and oriented to person, place, and time. Coordination normal.  Skin: Skin is warm and dry.  Psychiatric: She has a normal mood and affect.   Vitals:   05/26/17 1334  BP: 118/70  Pulse: 75  Temp: 98.4 F (36.9 C)  TempSrc: Oral  SpO2: 100%  Weight: 137 lb (62.1 kg)  Height: 5\' 3"  (1.6 m)      Assessment & Plan:

## 2017-05-26 NOTE — Patient Instructions (Signed)
We have sent in the ear drops. Use 3 drops 3 times per day for 3 days.   We are checking the labs.   Try using the nose spray daily for 1-2 weeks. The pain is likely coming from seasonal allergy changes.   Health Maintenance, Female Adopting a healthy lifestyle and getting preventive care can go a long way to promote health and wellness. Talk with your health care provider about what schedule of regular examinations is right for you. This is a good chance for you to check in with your provider about disease prevention and staying healthy. In between checkups, there are plenty of things you can do on your own. Experts have done a lot of research about which lifestyle changes and preventive measures are most likely to keep you healthy. Ask your health care provider for more information. Weight and diet Eat a healthy diet  Be sure to include plenty of vegetables, fruits, low-fat dairy products, and lean protein.  Do not eat a lot of foods high in solid fats, added sugars, or salt.  Get regular exercise. This is one of the most important things you can do for your health. ? Most adults should exercise for at least 150 minutes each week. The exercise should increase your heart rate and make you sweat (moderate-intensity exercise). ? Most adults should also do strengthening exercises at least twice a week. This is in addition to the moderate-intensity exercise.  Maintain a healthy weight  Body mass index (BMI) is a measurement that can be used to identify possible weight problems. It estimates body fat based on height and weight. Your health care provider can help determine your BMI and help you achieve or maintain a healthy weight.  For females 28 years of age and older: ? A BMI below 18.5 is considered underweight. ? A BMI of 18.5 to 24.9 is normal. ? A BMI of 25 to 29.9 is considered overweight. ? A BMI of 30 and above is considered obese.  Watch levels of cholesterol and blood  lipids  You should start having your blood tested for lipids and cholesterol at 47 years of age, then have this test every 5 years.  You may need to have your cholesterol levels checked more often if: ? Your lipid or cholesterol levels are high. ? You are older than 47 years of age. ? You are at high risk for heart disease.  Cancer screening Lung Cancer  Lung cancer screening is recommended for adults 57-75 years old who are at high risk for lung cancer because of a history of smoking.  A yearly low-dose CT scan of the lungs is recommended for people who: ? Currently smoke. ? Have quit within the past 15 years. ? Have at least a 30-pack-year history of smoking. A pack year is smoking an average of one pack of cigarettes a day for 1 year.  Yearly screening should continue until it has been 15 years since you quit.  Yearly screening should stop if you develop a health problem that would prevent you from having lung cancer treatment.  Breast Cancer  Practice breast self-awareness. This means understanding how your breasts normally appear and feel.  It also means doing regular breast self-exams. Let your health care provider know about any changes, no matter how small.  If you are in your 20s or 30s, you should have a clinical breast exam (CBE) by a health care provider every 1-3 years as part of a regular health exam.  If you are 40 or older, have a CBE every year. Also consider having a breast X-ray (mammogram) every year.  If you have a family history of breast cancer, talk to your health care provider about genetic screening.  If you are at high risk for breast cancer, talk to your health care provider about having an MRI and a mammogram every year.  Breast cancer gene (BRCA) assessment is recommended for women who have family members with BRCA-related cancers. BRCA-related cancers include: ? Breast. ? Ovarian. ? Tubal. ? Peritoneal cancers.  Results of the assessment will  determine the need for genetic counseling and BRCA1 and BRCA2 testing.  Cervical Cancer Your health care provider may recommend that you be screened regularly for cancer of the pelvic organs (ovaries, uterus, and vagina). This screening involves a pelvic examination, including checking for microscopic changes to the surface of your cervix (Pap test). You may be encouraged to have this screening done every 3 years, beginning at age 78.  For women ages 39-65, health care providers may recommend pelvic exams and Pap testing every 3 years, or they may recommend the Pap and pelvic exam, combined with testing for human papilloma virus (HPV), every 5 years. Some types of HPV increase your risk of cervical cancer. Testing for HPV may also be done on women of any age with unclear Pap test results.  Other health care providers may not recommend any screening for nonpregnant women who are considered low risk for pelvic cancer and who do not have symptoms. Ask your health care provider if a screening pelvic exam is right for you.  If you have had past treatment for cervical cancer or a condition that could lead to cancer, you need Pap tests and screening for cancer for at least 20 years after your treatment. If Pap tests have been discontinued, your risk factors (such as having a new sexual partner) need to be reassessed to determine if screening should resume. Some women have medical problems that increase the chance of getting cervical cancer. In these cases, your health care provider may recommend more frequent screening and Pap tests.  Colorectal Cancer  This type of cancer can be detected and often prevented.  Routine colorectal cancer screening usually begins at 47 years of age and continues through 47 years of age.  Your health care provider may recommend screening at an earlier age if you have risk factors for colon cancer.  Your health care provider may also recommend using home test kits to check  for hidden blood in the stool.  A small camera at the end of a tube can be used to examine your colon directly (sigmoidoscopy or colonoscopy). This is done to check for the earliest forms of colorectal cancer.  Routine screening usually begins at age 71.  Direct examination of the colon should be repeated every 5-10 years through 47 years of age. However, you may need to be screened more often if early forms of precancerous polyps or small growths are found.  Skin Cancer  Check your skin from head to toe regularly.  Tell your health care provider about any new moles or changes in moles, especially if there is a change in a mole's shape or color.  Also tell your health care provider if you have a mole that is larger than the size of a pencil eraser.  Always use sunscreen. Apply sunscreen liberally and repeatedly throughout the day.  Protect yourself by wearing long sleeves, pants, a wide-brimmed hat, and  sunglasses whenever you are outside.  Heart disease, diabetes, and high blood pressure  High blood pressure causes heart disease and increases the risk of stroke. High blood pressure is more likely to develop in: ? People who have blood pressure in the high end of the normal range (130-139/85-89 mm Hg). ? People who are overweight or obese. ? People who are African American.  If you are 22-80 years of age, have your blood pressure checked every 3-5 years. If you are 14 years of age or older, have your blood pressure checked every year. You should have your blood pressure measured twice-once when you are at a hospital or clinic, and once when you are not at a hospital or clinic. Record the average of the two measurements. To check your blood pressure when you are not at a hospital or clinic, you can use: ? An automated blood pressure machine at a pharmacy. ? A home blood pressure monitor.  If you are between 19 years and 5 years old, ask your health care provider if you should take  aspirin to prevent strokes.  Have regular diabetes screenings. This involves taking a blood sample to check your fasting blood sugar level. ? If you are at a normal weight and have a low risk for diabetes, have this test once every three years after 47 years of age. ? If you are overweight and have a high risk for diabetes, consider being tested at a younger age or more often. Preventing infection Hepatitis B  If you have a higher risk for hepatitis B, you should be screened for this virus. You are considered at high risk for hepatitis B if: ? You were born in a country where hepatitis B is common. Ask your health care provider which countries are considered high risk. ? Your parents were born in a high-risk country, and you have not been immunized against hepatitis B (hepatitis B vaccine). ? You have HIV or AIDS. ? You use needles to inject street drugs. ? You live with someone who has hepatitis B. ? You have had sex with someone who has hepatitis B. ? You get hemodialysis treatment. ? You take certain medicines for conditions, including cancer, organ transplantation, and autoimmune conditions.  Hepatitis C  Blood testing is recommended for: ? Everyone born from [birth year] through 1966. ? Anyone with known risk factors for hepatitis C.  Sexually transmitted infections (STIs)  You should be screened for sexually transmitted infections (STIs) including gonorrhea and chlamydia if: ? You are sexually active and are younger than 47 years of age. ? You are older than 47 years of age and your health care provider tells you that you are at risk for this type of infection. ? Your sexual activity has changed since you were last screened and you are at an increased risk for chlamydia or gonorrhea. Ask your health care provider if you are at risk.  If you do not have HIV, but are at risk, it may be recommended that you take a prescription medicine daily to prevent HIV infection. This is called  pre-exposure prophylaxis (PrEP). You are considered at risk if: ? You are sexually active and do not regularly use condoms or know the HIV status of your partner(s). ? You take drugs by injection. ? You are sexually active with a partner who has HIV.  Talk with your health care provider about whether you are at high risk of being infected with HIV. If you choose to begin PrEP, you  should first be tested for HIV. You should then be tested every 3 months for as long as you are taking PrEP. Pregnancy  If you are premenopausal and you may become pregnant, ask your health care provider about preconception counseling.  If you may become pregnant, take 400 to 800 micrograms (mcg) of folic acid every day.  If you want to prevent pregnancy, talk to your health care provider about birth control (contraception). Osteoporosis and menopause  Osteoporosis is a disease in which the bones lose minerals and strength with aging. This can result in serious bone fractures. Your risk for osteoporosis can be identified using a bone density scan.  If you are 69 years of age or older, or if you are at risk for osteoporosis and fractures, ask your health care provider if you should be screened.  Ask your health care provider whether you should take a calcium or vitamin D supplement to lower your risk for osteoporosis.  Menopause may have certain physical symptoms and risks.  Hormone replacement therapy may reduce some of these symptoms and risks. Talk to your health care provider about whether hormone replacement therapy is right for you. Follow these instructions at home:  Schedule regular health, dental, and eye exams.  Stay current with your immunizations.  Do not use any tobacco products including cigarettes, chewing tobacco, or electronic cigarettes.  If you are pregnant, do not drink alcohol.  If you are breastfeeding, limit how much and how often you drink alcohol.  Limit alcohol intake to no more  than 1 drink per day for nonpregnant women. One drink equals 12 ounces of beer, 5 ounces of wine, or 1 ounces of hard liquor.  Do not use street drugs.  Do not share needles.  Ask your health care provider for help if you need support or information about quitting drugs.  Tell your health care provider if you often feel depressed.  Tell your health care provider if you have ever been abused or do not feel safe at home. This information is not intended to replace advice given to you by your health care provider. Make sure you discuss any questions you have with your health care provider. Document Released: 02/11/2011 Document Revised: 01/04/2016 Document Reviewed: 05/02/2015 Elsevier Interactive Patient Education  [employer].

## 2017-05-27 ENCOUNTER — Other Ambulatory Visit (INDEPENDENT_AMBULATORY_CARE_PROVIDER_SITE_OTHER): Payer: 59

## 2017-05-27 DIAGNOSIS — Z0000 Encounter for general adult medical examination without abnormal findings: Secondary | ICD-10-CM

## 2017-05-27 LAB — COMPREHENSIVE METABOLIC PANEL
ALK PHOS: 51 U/L (ref 39–117)
ALT: 10 U/L (ref 0–35)
AST: 13 U/L (ref 0–37)
Albumin: 3.8 g/dL (ref 3.5–5.2)
BUN: 11 mg/dL (ref 6–23)
CHLORIDE: 106 meq/L (ref 96–112)
CO2: 24 meq/L (ref 19–32)
Calcium: 9.1 mg/dL (ref 8.4–10.5)
Creatinine, Ser: 0.67 mg/dL (ref 0.40–1.20)
GFR: 100.31 mL/min (ref >60.00)
GLUCOSE: 100 mg/dL — AB (ref 70–99)
POTASSIUM: 3.8 meq/L (ref 3.5–5.1)
SODIUM: 140 meq/L (ref 135–145)
Total Bilirubin: 0.5 mg/dL (ref 0.2–1.2)
Total Protein: 7.1 g/dL (ref 6.0–8.3)

## 2017-05-27 LAB — LIPID PANEL
CHOL/HDL RATIO: 3
Cholesterol: 172 mg/dL (ref 0–200)
HDL: 54.9 mg/dL (ref >39.00)
LDL Cholesterol: 99 mg/dL (ref 0–99)
NONHDL: 116.89
Triglycerides: 91 mg/dL (ref 0.0–149.0)
VLDL: 18.2 mg/dL (ref 0.0–40.0)

## 2017-05-27 LAB — T4, FREE: Free T4: 0.77 ng/dL (ref 0.60–1.60)

## 2017-05-27 LAB — CBC
HEMATOCRIT: 38.6 % (ref 36.0–46.0)
HEMOGLOBIN: 12.7 g/dL (ref 12.0–15.0)
MCHC: 32.8 g/dL (ref 30.0–36.0)
MCV: 89 fl (ref 78.0–100.0)
PLATELETS: 228 10*3/uL (ref 150.0–400.0)
RBC: 4.33 Mil/uL (ref 3.87–5.11)
RDW: 12.7 % (ref 11.5–15.5)
WBC: 5.9 10*3/uL (ref 4.0–10.5)

## 2017-05-27 LAB — VITAMIN B12: VITAMIN B 12: 414 pg/mL (ref 211–911)

## 2017-05-27 LAB — VITAMIN D 25 HYDROXY (VIT D DEFICIENCY, FRACTURES): VITD: 33.92 ng/mL (ref 30.00–100.00)

## 2017-05-27 LAB — TSH: TSH: 2.11 u[IU]/mL (ref 0.35–4.50)

## 2017-05-27 LAB — HEMOGLOBIN A1C: Hgb A1c MFr Bld: 5.9 % (ref 4.6–6.5)

## 2017-05-27 NOTE — Assessment & Plan Note (Signed)
Checking TSH and free T4. Some symptoms which could be clinically from thyroid.

## 2017-05-27 NOTE — Assessment & Plan Note (Signed)
Not taking flonase daily and encouraged her to do this while during season changes.

## 2017-05-27 NOTE — Assessment & Plan Note (Signed)
Pap smear up to date with gyn. Flu shot up to date. Declines tetanus today. Colonoscopy at 80. Counseled about sun safety and mole surveillance. Given screening recommendations.

## 2017-05-30 ENCOUNTER — Encounter: Payer: Self-pay | Admitting: Internal Medicine

## 2017-06-06 MED FILL — AMOXICILLIN 500 MG CAPSULE: 500 | 7 days supply | Qty: 28 | Fill #0

## 2017-06-17 ENCOUNTER — Ambulatory Visit: Payer: 59 | Admitting: Neurology

## 2017-06-17 ENCOUNTER — Encounter: Payer: Self-pay | Admitting: Neurology

## 2017-06-17 VITALS — BP 105/63 | HR 73 | Ht 63.0 in | Wt 138.5 lb

## 2017-06-17 DIAGNOSIS — G4489 Other headache syndrome: Secondary | ICD-10-CM | POA: Diagnosis not present

## 2017-06-17 DIAGNOSIS — G40309 Generalized idiopathic epilepsy and epileptic syndromes, not intractable, without status epilepticus: Secondary | ICD-10-CM | POA: Diagnosis not present

## 2017-06-17 MED ORDER — LEVETIRACETAM ER 500 MG PO TB24: ORAL_TABLET | ORAL | 3 refills | Status: DC

## 2017-06-17 NOTE — Progress Notes (Signed)
Reason for visit: Seizures  Anthonella Klausner Service is an 46 y.o. female  History of present illness:  Ms. Fernicola is a 46 year old right-handed [place] female with a history of seizures that have been well controlled on Keppra taking 1000 mg in the morning.  The patient last had a seizure in 2012, she currently is able to operate a motor vehicle without difficulty, she is working.  The patient reports that she has been placed on low-dose estrogen for fibroids of the uterus, she has been on this for over a year.  Within the last 1 month she has developed some headaches around the ears and into the temporal regions bilaterally that occurred following a dental procedure.  The patient has been treated with antibiotics, the headaches seem to improve on the left side, but she still has some discomfort on the right going to the temporal area.  The patient reports no photophobia or phonophobia, she denies any nausea or vomiting.  She indicates that the headache discomfort is very mild, she does not have to take anything for it.  The headache is not present at all times.  On occasion she may have some discomfort down into the throat.  She has no sinus drainage.  The patient returns for an evaluation.    Past Medical History:  Diagnosis Date  . Seizure [facility])     History reviewed. No pertinent surgical history.  Family History  Problem Relation Age of Onset  . Diabetes Father   . Anemia Father   . Seizures Neg Hx     Social history:  reports that  has never smoked. she has never used smokeless tobacco. She reports that she does not drink alcohol or use drugs.   No Known Allergies  Medications:  Prior to Admission medications   Medication Sig Start Date End Date Taking? Authorizing Provider  cholecalciferol (VITAMIN D) 1000 UNITS tablet Take 1,000 Units by mouth daily.   Yes [provider]  Cyanocobalamin (VITAMIN B 12 PO) Take 1 tablet by mouth daily.   Yes [provider]    estradiol (ESTRACE) 2 MG tablet  04/17/17  Yes [provider]  fluticasone (FLONASE) 50 MCG/ACT nasal spray Place 2 sprays into both nostrils daily. 11/01/16  Yes Biagio Borg, MD  levETIRAcetam (KEPPRA XR) 500 MG 24 hr tablet Take 2 every morning 06/14/16  Yes Kathrynn Ducking, MD  Multiple Vitamin (MULTIVITAMIN) tablet Take 1 tablet by mouth daily.   Yes [provider]  neomycin-polymyxin-hydrocortisone (CORTISPORIN) OTIC solution Place 3 drops into both ears 3 (three) times daily. 05/26/17  Yes Hoyt Koch, MD  pseudoephedrine-guaifenesin [facility] D) 60-600 MG 12 hr tablet Take 1 tablet by mouth 2 (two) times daily as needed for congestion. 11/01/16  Yes Biagio Borg, MD    ROS:  Out of a complete 14 system review of symptoms, the patient complains only of the following symptoms, and all other reviewed systems are negative.  Headache  Blood pressure 105/63, pulse 73, height 5\' 3"  (1.6 m), weight 138 lb 8 oz (62.8 kg).  Physical Exam  General: The patient is alert and cooperative at the time of the examination.  Ears: Tympanic membranes are clear bilaterally.  Neuromuscular: The patient has some crepitus in the left temporomandibular joint with jaw opening and closure, she is able to generate good excursion of the jaw with opening and closure.  Skin: No significant peripheral edema is noted.   Neurologic Exam  Mental status:  The patient is alert and oriented x 3 at the time of the examination. The patient has apparent normal recent and remote memory, with an apparently normal attention span and concentration ability.   Cranial nerves: Facial symmetry is present. Speech is normal, no aphasia or dysarthria is noted. Extraocular movements are full. Visual fields are full.  Motor: The patient has good strength in all 4 extremities.  Sensory examination: Soft touch sensation is symmetric on the face, arms, and legs.  Coordination: The patient has good  finger-nose-finger and heel-to-shin bilaterally.  Gait and station: The patient has a normal gait. Tandem gait is normal. Romberg is negative. No drift is seen.  Reflexes: Deep tendon reflexes are symmetric.   Assessment/Plan:  1.  History of seizures, well controlled  2.  New onset headache  The patient may have a neuromuscular etiology of the headache that began after a dental procedure.  The patient is improving significantly, the headache is quite mild.  We will follow the patient conservatively but if the headache worsens, she is to contact our office.  The patient was given a prescription for her Keppra, she will follow-up in 1 year.   Jill Alexanders MD 06/17/2017 8:06 AM  [facility] [address] [place], [place] [address]  Phone [phone] Fax [phone]

## 2017-07-07 MED FILL — LEVETIRACETAM ER 500 MG TAB: 500 | 90 days supply | Qty: 180 | Fill #0

## 2017-07-24 MED FILL — ESTRADIOL 2 MG TABS: 2 | 90 days supply | Qty: 90 | Fill #1

## 2017-07-29 DIAGNOSIS — J322 Chronic ethmoidal sinusitis: Secondary | ICD-10-CM | POA: Diagnosis not present

## 2017-07-29 DIAGNOSIS — J320 Chronic maxillary sinusitis: Secondary | ICD-10-CM | POA: Diagnosis not present

## 2017-07-29 DIAGNOSIS — J039 Acute tonsillitis, unspecified: Secondary | ICD-10-CM | POA: Diagnosis not present

## 2017-07-29 DIAGNOSIS — J040 Acute laryngitis: Secondary | ICD-10-CM | POA: Diagnosis not present

## 2017-07-29 DIAGNOSIS — J301 Allergic rhinitis due to pollen: Secondary | ICD-10-CM | POA: Diagnosis not present

## 2017-07-29 MED FILL — CEFUROXIME AXETIL 250 MG TA: 250 | 10 days supply | Qty: 20 | Fill #0

## 2017-08-11 ENCOUNTER — Other Ambulatory Visit: Payer: 59

## 2017-08-11 ENCOUNTER — Ambulatory Visit (INDEPENDENT_AMBULATORY_CARE_PROVIDER_SITE_OTHER): Payer: 59 | Admitting: Internal Medicine

## 2017-08-11 ENCOUNTER — Encounter: Payer: Self-pay | Admitting: Internal Medicine

## 2017-08-11 VITALS — BP 102/70 | HR 78 | Temp 98.4°F | Resp 16 | Ht 63.0 in | Wt 136.8 lb

## 2017-08-11 DIAGNOSIS — R350 Frequency of micturition: Secondary | ICD-10-CM | POA: Diagnosis not present

## 2017-08-11 DIAGNOSIS — N3001 Acute cystitis with hematuria: Secondary | ICD-10-CM | POA: Diagnosis not present

## 2017-08-11 LAB — POC URINALSYSI DIPSTICK (AUTOMATED)
Bilirubin, UA: NEGATIVE
Blood, UA: POSITIVE
GLUCOSE UA: NEGATIVE
KETONES UA: NEGATIVE
Nitrite, UA: NEGATIVE
Protein, UA: NEGATIVE
Spec Grav, UA: 1.01 (ref 1.010–1.025)
Urobilinogen, UA: 0.2 E.U./dL
pH, UA: 6 (ref 5.0–8.0)

## 2017-08-11 MED ORDER — NITROFURANTOIN MONOHYD MACRO 100 MG PO CAPS: 100.0000 mg | ORAL_CAPSULE | Freq: Two times a day (BID) | ORAL | 0 refills | Status: AC

## 2017-08-11 MED FILL — NITROFURANTOIN MONO-MCR 100: 100 | 5 days supply | Qty: 10 | Fill #0

## 2017-08-11 NOTE — Progress Notes (Signed)
Subjective:  Patient ID: Lori [name], female    DOB: Dec 01, 1970  Age: 46 y.o. MRN: [id number]  CC: Urinary Tract Infection   HPI Lori [name] presents for a 4-day history of dysuria, urgency, frequency, nausea, and bladder pain.  Outpatient Medications Prior to Visit  Medication Sig Dispense Refill  . cholecalciferol (VITAMIN D) 1000 UNITS tablet Take 1,000 Units by mouth daily.    . Cyanocobalamin (VITAMIN B 12 PO) Take 1 tablet by mouth daily.    Marland Kitchen estradiol (ESTRACE) 2 MG tablet   12  . fluticasone (FLONASE) 50 MCG/ACT nasal spray Place 2 sprays into both nostrils daily. 16 g 6  . levETIRAcetam (KEPPRA XR) 500 MG 24 hr tablet Take 2 every morning 180 tablet 3  . Multiple Vitamin (MULTIVITAMIN) tablet Take 1 tablet by mouth daily.    Marland Kitchen neomycin-polymyxin-hydrocortisone (CORTISPORIN) OTIC solution Place 3 drops into both ears 3 (three) times daily. 10 mL 0  . pseudoephedrine-guaifenesin (MUCINEX D) 60-600 MG 12 hr tablet Take 1 tablet by mouth 2 (two) times daily as needed for congestion. 40 tablet 0   No facility-administered medications prior to visit.     ROS Review of Systems  Constitutional: Negative for chills, fatigue and fever.  HENT: Negative.   Eyes: Negative.  Negative for visual disturbance.  Respiratory: Negative.  Negative for cough, chest tightness, shortness of breath and wheezing.   Gastrointestinal: Positive for nausea. Negative for abdominal pain, diarrhea and vomiting.  Endocrine: Negative.   Genitourinary: Positive for dysuria, frequency and pelvic pain. Negative for decreased urine volume, difficulty urinating, flank pain, hematuria, urgency, vaginal bleeding, vaginal discharge and vaginal pain.  Musculoskeletal: Negative.   Skin: Negative.  Negative for color change and rash.  Allergic/Immunologic: Negative.   Neurological: Negative.   Hematological: Negative for adenopathy. Does not bruise/bleed easily.  Psychiatric/Behavioral: Negative.      Objective:  BP 102/70 (BP Location: Left Arm, Patient Position: Sitting, Cuff Size: Normal)   Pulse 78   Temp 98.4 F (36.9 C) (Oral)   Resp 16   Ht 5\' 3"  (1.6 m)   Wt 136 lb 12 oz (62 kg)   SpO2 98%   BMI 24.22 kg/m   BP Readings from Last 3 Encounters:  08/11/17 102/70  06/17/17 105/63  05/26/17 118/70    Wt Readings from Last 3 Encounters:  08/11/17 136 lb 12 oz (62 kg)  06/17/17 138 lb 8 oz (62.8 kg)  05/26/17 137 lb (62.1 kg)    Physical Exam  Constitutional: She is oriented to person, place, and time. No distress.  HENT:  Mouth/Throat: Oropharynx is clear and moist. No oropharyngeal exudate.  Eyes: Conjunctivae are normal. Left eye exhibits no discharge. No scleral icterus.  Neck: Normal range of motion. Neck supple. No JVD present. No thyromegaly present.  Cardiovascular: Normal rate, regular rhythm and normal heart sounds.  No murmur heard. Pulmonary/Chest: Effort normal and breath sounds normal. No respiratory distress. She has no wheezes. She has no rales.  Abdominal: Soft. Bowel sounds are normal. She exhibits no mass. There is no tenderness. There is no guarding and no CVA tenderness.  Musculoskeletal: Normal range of motion. She exhibits no edema or tenderness.  Lymphadenopathy:    She has no cervical adenopathy.  Neurological: She is oriented to person, place, and time.  Skin: Skin is warm and dry. No rash noted. She is not diaphoretic. No erythema. No pallor.  Vitals reviewed.   Lab Results  Component Value Date  WBC 5.9 05/27/2017   HGB 12.7 05/27/2017   HCT 38.6 05/27/2017   PLT 228.0 05/27/2017   GLUCOSE 100 (H) 05/27/2017   CHOL 172 05/27/2017   TRIG 91.0 05/27/2017   HDL 54.90 05/27/2017   LDLCALC 99 05/27/2017   ALT 10 05/27/2017   AST 13 05/27/2017   NA 140 05/27/2017   K 3.8 05/27/2017   CL 106 05/27/2017   CREATININE 0.67 05/27/2017   BUN 11 05/27/2017   CO2 24 05/27/2017   TSH 2.11 05/27/2017   HGBA1C 5.9 05/27/2017     Nm Thyroid Sng Uptake W/imaging  Result Date: 05/10/2015 CLINICAL DATA:  Thyrotoxicosis without thyroid storm, TSH = 0.01 EXAM: THYROID SCAN AND UPTAKE - 24 HOURS TECHNIQUE: Following the per oral administration of I-131 sodium iodide, the patient returned at 24 hours and uptake measurements were acquired with the uptake probe centered on the neck. Thyroid imaging was performed following the intravenous administration of the Tc-42m Pertechnetate. RADIOPHARMACEUTICALS:  8.6 microCuries I-131 sodium iodide orally and 9.8 mCi Technetium-75m pertechnetate IV COMPARISON:  None FINDINGS: 24 hour radio iodine uptake calculated at 0.9%. Images of the thyroid gland in 3 projections demonstrate only mild diffuse tracer localization throughout the thyroid lobes. Small focal area of increased trace localization is seen in the mid RIGHT lobe consistent with a small hyper functional adenoma. IMPRESSION: Markedly decreased 24 hour radio iodine uptake of 9%. 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 Electronically Signed   By: Lavonia Dana M.D.   On: 05/10/2015 15:17      Assessment & Plan:   Lori [name] was seen today for urinary tract infection.  Diagnoses and all orders for this visit:  Frequent urination- Her UA is positive for RBCs and WBCs.  The urine culture is pending. -     POCT Urinalysis Dipstick (Automated) -     CULTURE, URINE COMPREHENSIVE; Future  Acute cystitis with hematuria- Will empirically treat with nitrofurantoin.  Will change the antibiotic regimen, if indicated, based on the culture and sensitivity. -     nitrofurantoin, macrocrystal-monohydrate, (MACROBID) 100 MG capsule; Take 1 capsule (100 mg total) by mouth 2 (two) times daily for 5 days.   I have discontinued Lori [name]'s pseudoephedrine-guaifenesin and neomycin-polymyxin-hydrocortisone. I am also having her start on nitrofurantoin (macrocrystal-monohydrate). Additionally, I am having her maintain her  cholecalciferol, multivitamin, Cyanocobalamin (VITAMIN B 12 PO), fluticasone, estradiol, and levETIRAcetam.  Meds ordered this encounter  Medications  . nitrofurantoin, macrocrystal-monohydrate, (MACROBID) 100 MG capsule    Sig: Take 1 capsule (100 mg total) by mouth 2 (two) times daily for 5 days.    Dispense:  10 capsule    Refill:  0     Follow-up: Return if symptoms worsen or fail to improve.  Scarlette Calico, MD

## 2017-08-11 NOTE — Patient Instructions (Signed)

## 2017-08-14 LAB — CULTURE, URINE COMPREHENSIVE
MICRO NUMBER: 81463548
SPECIMEN QUALITY:: ADEQUATE

## 2017-08-15 ENCOUNTER — Encounter: Payer: Self-pay | Admitting: Internal Medicine

## 2017-08-19 DIAGNOSIS — J322 Chronic ethmoidal sinusitis: Secondary | ICD-10-CM | POA: Diagnosis not present

## 2017-08-19 DIAGNOSIS — J320 Chronic maxillary sinusitis: Secondary | ICD-10-CM | POA: Diagnosis not present

## 2017-08-19 DIAGNOSIS — J370 Chronic laryngitis: Secondary | ICD-10-CM | POA: Diagnosis not present

## 2017-08-19 DIAGNOSIS — J039 Acute tonsillitis, unspecified: Secondary | ICD-10-CM | POA: Diagnosis not present

## 2017-10-01 DIAGNOSIS — J309 Allergic rhinitis, unspecified: Secondary | ICD-10-CM | POA: Diagnosis not present

## 2017-10-01 DIAGNOSIS — J301 Allergic rhinitis due to pollen: Secondary | ICD-10-CM | POA: Diagnosis not present

## 2017-10-01 DIAGNOSIS — J3081 Allergic rhinitis due to animal (cat) (dog) hair and dander: Secondary | ICD-10-CM | POA: Diagnosis not present

## 2017-10-01 MED FILL — predniSONE 10 MG TABS: 10 | 4 days supply | Qty: 10 | Fill #0

## 2017-10-01 MED FILL — MONTELUKAST SOD 10 MG TAB: 10 | 30 days supply | Qty: 30 | Fill #0

## 2017-10-01 MED FILL — AZELASTINE 0.1% (137 MCG) S: 0.1 | 25 days supply | Qty: 30 | Fill #0

## 2017-10-16 MED FILL — LEVETIRACETAM ER 500 MG TAB: 500 | 90 days supply | Qty: 180 | Fill #1

## 2017-10-28 DIAGNOSIS — J342 Deviated nasal septum: Secondary | ICD-10-CM | POA: Diagnosis not present

## 2017-10-28 DIAGNOSIS — J343 Hypertrophy of nasal turbinates: Secondary | ICD-10-CM | POA: Diagnosis not present

## 2017-10-28 DIAGNOSIS — J310 Chronic rhinitis: Secondary | ICD-10-CM | POA: Diagnosis not present

## 2017-10-28 MED FILL — IPRATROPIUM 0.06% SPRAY: 0.06 | 21 days supply | Qty: 15 | Fill #0

## 2017-11-04 MED FILL — MONTELUKAST SOD 10 MG TAB: 10 | 30 days supply | Qty: 30 | Fill #1

## 2017-11-10 ENCOUNTER — Ambulatory Visit (INDEPENDENT_AMBULATORY_CARE_PROVIDER_SITE_OTHER): Payer: 59 | Admitting: Internal Medicine

## 2017-11-10 ENCOUNTER — Encounter: Payer: Self-pay | Admitting: Internal Medicine

## 2017-11-10 VITALS — BP 112/72 | HR 62 | Temp 98.7°F | Resp 16 | Wt 136.0 lb

## 2017-11-10 DIAGNOSIS — R51 Headache: Secondary | ICD-10-CM

## 2017-11-10 DIAGNOSIS — R002 Palpitations: Secondary | ICD-10-CM

## 2017-11-10 DIAGNOSIS — R1013 Epigastric pain: Secondary | ICD-10-CM | POA: Diagnosis not present

## 2017-11-10 DIAGNOSIS — R519 Headache, unspecified: Secondary | ICD-10-CM

## 2017-11-10 NOTE — Assessment & Plan Note (Signed)
This symptom has resolved and was likely related to atypical GERD from taking Excedrin extra strength Avoid Excedrin Extra Strength Immediately discontinue Advil, which she does not take on a regular basis Symptoms do not completely resolve she will let us know

## 2017-11-10 NOTE — Patient Instructions (Signed)
Hold the montelukast for now to see if your headache resolves.        If your headaches continue let us know so we can evaluate further.

## 2017-11-10 NOTE — Assessment & Plan Note (Signed)
Her headaches on top of the head near her eyes This is a new headache for her She thought it was related to using ipratropium nasal spray, but she only took 3 doses and after stopping the medication the headaches persisted ?  Headaches related to singular, which is also relatively new medication No other obvious causes She will hold the Singulair for now to see if the headaches resolve If they do not she will let us know so we can evaluate further

## 2017-11-10 NOTE — Assessment & Plan Note (Signed)
She had palpitations and other symptoms related to taking Excedrin Extra Strength Palpitations have improved since stopping the medication and I would expect they would continue to resolve completely We will hold off on further testing for now, but if symptoms persist will need further evaluation Will avoid taking Excedrin in the future

## 2017-11-10 NOTE — Progress Notes (Signed)
Subjective:    Patient ID: Lori [name], female    DOB: 1971/02/17, 47 y.o.   MRN: [id number]  HPI The patient is here for an acute visit.   She has had allergies and sinus problems for a while.  She is on allergy doctor and an ENT in the last few months.  She was thought to have allergies and started taking Singulair.  Last month she was prescribed ipratropium nasal spray and after 3 doses she felt it was giving her headaches were on the top of her head and she stopped using it.   The headaches continued.  She tried taking Advil, but it did not help and after 3 days she stopped it.  She then tried Excedrin extra strength, which she has never taken in the past.  She was taking 2 pills twice a day and 3 days ago she was experiencing heart racing, discomfort in her epigastric region that radiated up into her chest, decreased appetite, increased burping, palpitations, increased anxiety and a cool feeling in her arms and hands.  She thought it may be related to the Excedrin and she did stop the medication.  Since then she has been taking Advil-2 in the morning, 1 in the afternoon and 2 in the evening.  The Advil has not helped with her headaches, but her other symptoms have improved.  She feels the Advil helped calm everything down.  She is still having the headaches.  They are on the top of her head and she has pain near her eyes.  She has never had headaches like this in the past.  Last night she also started to experience left-sided ear pain and throat pain.  She denies any chest pain.  She was able to eat this morning.  The headaches ocnitnued -- started taking advil.  Did not help.  Took two excedrin es bid.  Three days ago had heart racing and discomfort in epigastric region.  She stopped taking it.  She took some advil three days ago.  Two days ago - no appettie, increased burping, pain in epigastric region and up into chest, increased anxiety and hands and arms feel cool.  advil two 2-3  times a day helped calm everyting down.   She still has some mild anxiety-like symptoms and heart racing, but overall the symptoms are much better.  Medications and allergies reviewed with patient and updated if appropriate.  Patient Active Problem List   Diagnosis Date Noted  . Acute cystitis with hematuria 08/11/2017  . Headache syndrome 06/17/2017  . Eustachian tube disorder, right 11/01/2016  . Allergic rhinitis 11/01/2016  . Left lumbar radiculopathy 06/25/2016  . Subacute thyroiditis 06/13/2015  . Routine general medical examination at a health care facility 07/28/2014  . 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 cholecalciferol (VITAMIN D) 1000 UNITS tablet Take 1,000 Units by mouth daily.    . Cyanocobalamin (VITAMIN B 12 PO) Take 1 tablet by mouth daily.    Marland Kitchen estradiol (ESTRACE) 2 MG tablet   12  . fluticasone (FLONASE) 50 MCG/ACT nasal spray Place 2 sprays into both nostrils daily. 16 g 6  . levETIRAcetam (KEPPRA XR) 500 MG 24 hr tablet Take 2 every morning 180 tablet 3  . montelukast (SINGULAIR) 10 MG tablet Take 10 mg by mouth daily.  3  . Multiple Vitamin (MULTIVITAMIN) tablet Take 1 tablet by mouth daily.  No current facility-administered medications on file prior to visit.     Past Medical History:  Diagnosis Date  . Seizure ([place])     No past surgical history on file.  Social History   Socioeconomic History  . Marital status: Married    Spouse name: Not on file  . Number of children: 2  . Years of education: Not on file  . Highest education level: Not on file  Occupational History    Employer: [employer]  . Financial resource strain: Not on file  . Food insecurity:    Worry: Not on file    Inability: Not on file  . Transportation needs:    Medical: Not on file    Non-medical: Not on file  Tobacco Use  . Smoking status: Never  Smoker  . Smokeless tobacco: Never Used  Substance and Sexual Activity  . Alcohol use: No  . Drug use: No  . Sexual activity: Not on file  Lifestyle  . Physical activity:    Days per week: Not on file    Minutes per session: Not on file  . Stress: Not on file  Relationships  . Social connections:    Talks on phone: Not on file    Gets together: Not on file    Attends religious service: Not on file    Active member of club or organization: Not on file    Attends meetings of clubs or organizations: Not on file    Relationship status: Not on file  Other Topics Concern  . Not on file  Social History Narrative   Patient is married and has 2 children.    Right-handed   Caffeine: 1 1/2 cups of tea per day    Family History  Problem Relation Age of Onset  . Diabetes Father   . Anemia Father   . Seizures Neg Hx     Review of Systems  Constitutional: Positive for appetite change (decreased - will burp if she eats something). Negative for chills and fever.  HENT: Positive for ear pain and sore throat (started last night). Negative for congestion, sinus pressure and sinus pain.   Respiratory: Negative for cough.   Neurological: Positive for headaches. Negative for dizziness and light-headedness.       Objective:   Vitals:   11/10/17 1050  BP: 112/72  Pulse: 62  Resp: 16  Temp: 98.7 F (37.1 C)  SpO2: 99%   BP Readings from Last 3 Encounters:  11/10/17 112/72  08/11/17 102/70  06/17/17 105/63   Wt Readings from Last 3 Encounters:  11/10/17 136 lb (61.7 kg)  08/11/17 136 lb 12 oz (62 kg)  06/17/17 138 lb 8 oz (62.8 kg)   Body mass index is 24.09 kg/m.   Physical Exam    GENERAL APPEARANCE: Appears stated age, well appearing, NAD EYES: conjunctiva clear, no icterus HEENT: bilateral tympanic membranes and ear canals normal, oropharynx with no erythema, no thyromegaly, trachea midline, no cervical or supraclavicular lymphadenopathy LUNGS: Clear to auscultation  without wheeze or crackles, unlabored breathing, good air entry bilaterally CARDIOVASCULAR: Normal S1,S2 without murmurs, no edema SKIN: Warm, dry      Assessment & Plan:    See Problem List for Assessment and Plan of chronic medical problems.

## 2017-11-14 MED FILL — ESTRADIOL 2 MG TABS: 2 | 90 days supply | Qty: 90 | Fill #2

## 2017-11-18 ENCOUNTER — Encounter: Payer: Self-pay | Admitting: Internal Medicine

## 2017-11-18 MED ORDER — AMOXICILLIN-POT CLAVULANATE 875-125 MG PO TABS: 1.0000 | ORAL_TABLET | Freq: Two times a day (BID) | ORAL | 0 refills | Status: DC

## 2017-11-19 MED FILL — AMOX TR-K CLV 875-125 MG TA: 875-125 | 10 days supply | Qty: 20 | Fill #0

## 2017-11-20 ENCOUNTER — Other Ambulatory Visit (INDEPENDENT_AMBULATORY_CARE_PROVIDER_SITE_OTHER): Payer: 59

## 2017-11-20 ENCOUNTER — Encounter: Payer: Self-pay | Admitting: Family Medicine

## 2017-11-20 ENCOUNTER — Telehealth: Payer: Self-pay | Admitting: Family Medicine

## 2017-11-20 ENCOUNTER — Ambulatory Visit (INDEPENDENT_AMBULATORY_CARE_PROVIDER_SITE_OTHER)
Admission: RE | Admit: 2017-11-20 | Discharge: 2017-11-20 | Disposition: A | Discharge status: Home / Self Care | Payer: 59 | Source: Ambulatory Visit | Attending: Family Medicine | Admitting: Family Medicine

## 2017-11-20 ENCOUNTER — Telehealth: Payer: Self-pay

## 2017-11-20 ENCOUNTER — Ambulatory Visit (INDEPENDENT_AMBULATORY_CARE_PROVIDER_SITE_OTHER): Payer: 59 | Admitting: Family Medicine

## 2017-11-20 VITALS — BP 118/62 | HR 60 | Temp 98.0°F | Ht 63.0 in | Wt 139.0 lb

## 2017-11-20 DIAGNOSIS — R079 Chest pain, unspecified: Secondary | ICD-10-CM

## 2017-11-20 LAB — TROPONIN I: TNIDX: 0 ug/L (ref 0.00–0.06)

## 2017-11-20 NOTE — Progress Notes (Signed)
Lori [name] - 47 y.o. female MRN [id number]  Date of birth: 02-13-1971  SUBJECTIVE:  Including CC & ROS.  Chief Complaint  Patient presents with  . Chest Pain    Lori [name] is a 47 y.o. female that is presenting with chest pain. Ongoing intermittently  for one week. She noticed the chest pain and discomfort after taking Excedrin for her migraine. Denies fevers . Admits to shortness of breath.  She started taking Augmentin yesterday for a sinus infection. The pain is in her left chest and shoulder. The pain radiates down her arm. She feels like her arms feel cold. She denies any family history of cardiac disease. She denies any cardiac history. Pain isn't related to exertion. Does have excessive belching at times.   An antibiotic was sent in on 4/9.   Review of Systems  Constitutional: Negative for fever.  HENT: Negative for congestion.   Respiratory: Negative for cough.   Cardiovascular: Positive for chest pain.  Musculoskeletal: Negative for gait problem.  Skin: Negative for color change.  Neurological: Positive for headaches.  Hematological: Negative for adenopathy.  Psychiatric/Behavioral: Negative for agitation.    HISTORY: Past Medical, Surgical, Social, and Family History Reviewed & Updated per EMR.   Pertinent Historical Findings include:  Past Medical History:  Diagnosis Date  . Seizure ([place])     No past surgical history on file.  No Known Allergies  Family History  Problem Relation Age of Onset  . Diabetes Father   . Anemia Father   . Seizures Neg Hx      Social History   Socioeconomic History  . Marital status: Married    Spouse name: Not on file  . Number of children: 2  . Years of education: Not on file  . Highest education level: Not on file  Occupational History    Employer: [employer]  . Financial resource strain: Not on file  . Food insecurity:    Worry: Not on file    Inability: Not on file  . Transportation needs:     Medical: Not on file    Non-medical: Not on file  Tobacco Use  . Smoking status: Never Smoker  . Smokeless tobacco: Never Used  Substance and Sexual Activity  . Alcohol use: No  . Drug use: No  . Sexual activity: Not on file  Lifestyle  . Physical activity:    Days per week: Not on file    Minutes per session: Not on file  . Stress: Not on file  Relationships  . Social connections:    Talks on phone: Not on file    Gets together: Not on file    Attends religious service: Not on file    Active member of club or organization: Not on file    Attends meetings of clubs or organizations: Not on file    Relationship status: Not on file  . Intimate partner violence:    Fear of current or ex partner: Not on file    Emotionally abused: Not on file    Physically abused: Not on file    Forced sexual activity: Not on file  Other Topics Concern  . Not on file  Social History Narrative   Patient is married and has 2 children.    Right-handed   Caffeine: 1 1/2 cups of tea per day     PHYSICAL EXAM:  VS: BP 118/62 (BP Location: Left Arm, Patient Position: Sitting, Cuff Size: Normal)  Pulse 60   Temp 98 F (36.7 C) (Oral)   Ht 5\' 3"  (1.6 m)   Wt 139 lb (63 kg)   SpO2 100%   BMI 24.62 kg/m  Physical Exam Gen: NAD, alert, cooperative with exam,  ENT: normal lips, normal nasal mucosa, tympanic membranes clear and intact bilaterally, normal oropharynx, no cervical lymphadenopathy Eye: normal EOM, normal conjunctiva and lids CV:  no edema, +2 pedal pulses, regular rate and rhythm, S1-S2   Resp: no accessory muscle use, non-labored, clear to auscultation bilaterally, no crackles or wheezes Skin: no rashes, no areas of induration  Neuro: normal tone, normal sensation to touch Psych:  normal insight, alert and oriented MSK: Normal gait, normal strength, mild TTP to the left chest and sternum. Normal left shoulder ROM. No discoloration of the left or right arm. Normal pulses in upper  extremities b/l      EKG interpretation: normal sinus rhythm .    ASSESSMENT & PLAN:   Chest pain Low likelihood of cardiac or pulmonary in nature. No history of blood clots. Possible for GERD with belching  - EKG, ddimer, troponin - if normal would consider treating with PPI  - given indications to seek immediate care

## 2017-11-20 NOTE — Telephone Encounter (Signed)
Patient came in office today, her chest discomfort is back---she did start abx like ordered yesterday---her pain surrounds her left breast and goes up her shoulder and down her left arm---her left arm feels very cold---patient saw dr burns last week, per her office visit note, further evaluation would be needed if symptoms persist/worsen---routing to dr burns,please advise what you would like patient to do---I will call Shatyra at ext#[phone]---thanks

## 2017-11-20 NOTE — Telephone Encounter (Signed)
Has appt with Dr Raeford Razor today

## 2017-11-20 NOTE — Telephone Encounter (Signed)
Informed patient of her results.   Rosemarie Ax, MD [facility] 11/20/2017, 5:33 PM

## 2017-11-20 NOTE — Patient Instructions (Signed)
We will call you with the results from today.  

## 2017-11-21 ENCOUNTER — Encounter: Payer: Self-pay | Admitting: Family Medicine

## 2017-11-21 LAB — D-DIMER, QUANTITATIVE: D-Dimer, Quant: 0.41 ug{FEU}/mL

## 2017-11-21 MED ORDER — PANTOPRAZOLE SODIUM 40 MG PO TBEC: 40.0000 mg | DELAYED_RELEASE_TABLET | Freq: Every day | ORAL | 0 refills | Status: DC

## 2017-11-21 MED FILL — PANTOPRAZOLE SOD DR 40 MG T: 40 | 30 days supply | Qty: 30 | Fill #0

## 2017-11-21 NOTE — Telephone Encounter (Signed)
-----   Message from Shanon Ace, [place] sent at 11/21/2017 11:01 AM EDT ----- Patient notified of results, she would like to proceed with treatment of acid reflux. She has been experiencing several occasions of indigestion and burping. She has also been having episodes of anxiety would like to know if this related to taking her migraine medication she took a couple of weeks ago. Please advise

## 2017-11-21 NOTE — Assessment & Plan Note (Signed)
Low likelihood of cardiac or pulmonary in nature. No history of blood clots. Possible for GERD with belching  - EKG, ddimer, troponin - if normal would consider treating with PPI  - given indications to seek immediate care

## 2017-11-21 NOTE — Telephone Encounter (Signed)
Left VM for patient. If she calls back please have her speak with a nurse/CMA and inform that I will send in a PPI for reflux. It would be unlikely that her anxiety would be associated with her migraine medication. That medication would be out of her system by now. If she has concerns with her anxiety then she should follow up with Dr. Sharlet Salina.   If any questions then please take the best time and phone number to call and I will try to call her back.   Rosemarie Ax, MD [facility] 11/21/2017, 1:59 PM

## 2017-11-25 ENCOUNTER — Other Ambulatory Visit (INDEPENDENT_AMBULATORY_CARE_PROVIDER_SITE_OTHER): Payer: 59

## 2017-11-25 ENCOUNTER — Encounter: Payer: Self-pay | Admitting: Family Medicine

## 2017-11-25 ENCOUNTER — Other Ambulatory Visit: Payer: Self-pay | Admitting: Internal Medicine

## 2017-11-25 DIAGNOSIS — E061 Subacute thyroiditis: Secondary | ICD-10-CM

## 2017-11-25 LAB — T4, FREE: FREE T4: 0.75 ng/dL (ref 0.60–1.60)

## 2017-11-25 LAB — TSH: TSH: 1.75 u[IU]/mL (ref 0.35–4.50)

## 2017-11-25 LAB — T3, FREE: T3, Free: 3.9 pg/mL (ref 2.3–4.2)

## 2017-12-03 DIAGNOSIS — H524 Presbyopia: Secondary | ICD-10-CM | POA: Diagnosis not present

## 2017-12-09 MED FILL — MONTELUKAST SOD 10 MG TAB: 10 | 30 days supply | Qty: 30 | Fill #2

## 2017-12-16 MED FILL — AZELASTINE HCL 137 MCG/SPRA: 137 | 25 days supply | Qty: 30 | Fill #1

## 2018-01-02 MED FILL — MONTELUKAST SOD 10 MG TAB: 10 | 30 days supply | Qty: 30 | Fill #3

## 2018-01-13 MED FILL — LEVETIRACETAM ER 500 MG TAB: 500 | 90 days supply | Qty: 180 | Fill #2

## 2018-02-03 ENCOUNTER — Ambulatory Visit (INDEPENDENT_AMBULATORY_CARE_PROVIDER_SITE_OTHER): Payer: 59 | Admitting: Internal Medicine

## 2018-02-03 ENCOUNTER — Encounter: Payer: Self-pay | Admitting: Internal Medicine

## 2018-02-03 VITALS — BP 96/60 | HR 93 | Temp 98.1°F | Ht 63.0 in | Wt 139.0 lb

## 2018-02-03 DIAGNOSIS — R202 Paresthesia of skin: Secondary | ICD-10-CM | POA: Diagnosis not present

## 2018-02-03 DIAGNOSIS — R200 Anesthesia of skin: Secondary | ICD-10-CM | POA: Diagnosis not present

## 2018-02-03 DIAGNOSIS — M546 Pain in thoracic spine: Secondary | ICD-10-CM

## 2018-02-03 DIAGNOSIS — R42 Dizziness and giddiness: Secondary | ICD-10-CM | POA: Insufficient documentation

## 2018-02-03 MED ORDER — DOXYCYCLINE HYCLATE 100 MG PO TABS: 100.0000 mg | ORAL_TABLET | Freq: Two times a day (BID) | ORAL | 0 refills | Status: DC

## 2018-02-03 MED FILL — DOXYCYCLINE HYCLATE 100 MG: 100 | 7 days supply | Qty: 14 | Fill #0

## 2018-02-03 NOTE — Assessment & Plan Note (Signed)
Could be related to lyme disease from recent tick bite. Rx for doxycycline. Recent thyroid panel normal. If no improvement with doxycycline will check CBC, CMP, anemia labs, vitamin D, B12.

## 2018-02-03 NOTE — Assessment & Plan Note (Signed)
Due to tick bite, rx for doxycycline given her other systemic symptoms as well as ongoing rash and unknown duration of latching.

## 2018-02-03 NOTE — Assessment & Plan Note (Signed)
Unclear etiology. She is not having good appetite and poor rest recently. She likely has some sleep debt and will monitor symptoms with doxycyline and more rest. If no improvement needs labs including cbc, cmp, ferritin, b12, thyroid.

## 2018-02-03 NOTE — Patient Instructions (Signed)
We have sent in doxycycline to take 1 pill twice a day for 1 week.   Let us know if you are not feeling better and we can check some blood work.

## 2018-02-03 NOTE — Progress Notes (Signed)
   Subjective:    Patient ID: Lori [name], female    DOB: 30-Jan-1971, 47 y.o.   MRN: [id number]  HPI The patient is a 47 YO female coming in for concerns about tick bite (happened about 2-3 weeks ago, between her shoulder blades, still having a spot where the tick was, no skin rash around the tick bite, some fatigue and burning sensation since that time, tick was on skin for unknown duration, brought tick with her) and burning sensation in left hand (comes and goes, started after the tick bite, denies cause, some pain as well, lasts about few minutes, denies chest pains) as well as lightheadedness (goes on for several days, denies change in diet or exercise, she has been more busy and less rest in the last several weeks but after 2-3 days of good rest she has not noticed any relief, denies dizziness or falls, she is eating normally, less evening appetite, snacking some during the day if needed).   Review of Systems  Constitutional: Positive for activity change, appetite change and fatigue.  HENT: Negative.   Eyes: Negative.   Respiratory: Negative for cough, chest tightness and shortness of breath.   Cardiovascular: Negative for chest pain, palpitations and leg swelling.  Gastrointestinal: Negative for abdominal distention, abdominal pain, constipation, diarrhea, nausea and vomiting.  Musculoskeletal: Positive for arthralgias and myalgias. Negative for back pain, gait problem and joint swelling.  Skin: Positive for rash and wound.  Neurological: Positive for light-headedness, numbness and headaches. Negative for dizziness, tremors, seizures, syncope, facial asymmetry and weakness.  Psychiatric/Behavioral: Negative.       Objective:   Physical Exam  Constitutional: She is oriented to person, place, and time. She appears well-developed and well-nourished.  HENT:  Head: Normocephalic and atraumatic.  Eyes: EOM are normal.  Neck: Normal range of motion.  Cardiovascular: Normal rate and  regular rhythm.  Pulmonary/Chest: Effort normal and breath sounds normal. No respiratory distress. She has no wheezes. She has no rales.  Abdominal: Soft. Bowel sounds are normal. She exhibits no distension. There is no tenderness. There is no rebound.  Musculoskeletal: She exhibits no edema.  Neurological: She is alert and oriented to person, place, and time. No cranial nerve deficit. Coordination normal.  No orthostatic symptoms  Skin: Skin is warm and dry.  Wound on the back midline thoracic with signs of healing, some surrounding erythema but no target rash  Psychiatric: She has a normal mood and affect.   Vitals:   02/03/18 0822  BP: 96/60  Pulse: 93  Temp: 98.1 F (36.7 C)  TempSrc: Oral  SpO2: 98%  Weight: 139 lb (63 kg)  Height: 5\' 3"  (1.6 m)      Assessment & Plan:

## 2018-02-13 MED FILL — MONTELUKAST SOD 10 MG TAB: 10 | 30 days supply | Qty: 30 | Fill #0

## 2018-02-23 MED FILL — ESTRADIOL 2 MG TABLET: 2 | 90 days supply | Qty: 90 | Fill #3

## 2018-03-10 DIAGNOSIS — Z1231 Encounter for screening mammogram for malignant neoplasm of breast: Secondary | ICD-10-CM | POA: Diagnosis not present

## 2018-03-10 DIAGNOSIS — Z6825 Body mass index (BMI) 25.0-25.9, adult: Secondary | ICD-10-CM | POA: Diagnosis not present

## 2018-03-10 DIAGNOSIS — Z01419 Encounter for gynecological examination (general) (routine) without abnormal findings: Secondary | ICD-10-CM | POA: Diagnosis not present

## 2018-03-10 LAB — HM MAMMOGRAPHY

## 2018-03-10 LAB — HM PAP SMEAR

## 2018-03-18 DIAGNOSIS — J3081 Allergic rhinitis due to animal (cat) (dog) hair and dander: Secondary | ICD-10-CM | POA: Diagnosis not present

## 2018-03-18 DIAGNOSIS — J301 Allergic rhinitis due to pollen: Secondary | ICD-10-CM | POA: Diagnosis not present

## 2018-03-18 MED FILL — MONTELUKAST SOD 10 MG TAB: 10 | 30 days supply | Qty: 30 | Fill #0

## 2018-04-14 MED FILL — MONTELUKAST SOD 10 MG TAB: 10 | 30 days supply | Qty: 30 | Fill #1

## 2018-04-14 MED FILL — LEVETIRACETAM ER 500 MG TAB: 500 | 90 days supply | Qty: 180 | Fill #3

## 2018-06-01 ENCOUNTER — Ambulatory Visit (INDEPENDENT_AMBULATORY_CARE_PROVIDER_SITE_OTHER): Payer: 59 | Admitting: Internal Medicine

## 2018-06-01 ENCOUNTER — Encounter: Payer: Self-pay | Admitting: Internal Medicine

## 2018-06-01 ENCOUNTER — Other Ambulatory Visit (INDEPENDENT_AMBULATORY_CARE_PROVIDER_SITE_OTHER): Payer: 59

## 2018-06-01 VITALS — BP 108/60 | HR 70 | Temp 98.0°F | Ht 63.0 in | Wt 139.0 lb

## 2018-06-01 DIAGNOSIS — Z0000 Encounter for general adult medical examination without abnormal findings: Secondary | ICD-10-CM

## 2018-06-01 DIAGNOSIS — J302 Other seasonal allergic rhinitis: Secondary | ICD-10-CM

## 2018-06-01 DIAGNOSIS — E061 Subacute thyroiditis: Secondary | ICD-10-CM

## 2018-06-01 DIAGNOSIS — G40309 Generalized idiopathic epilepsy and epileptic syndromes, not intractable, without status epilepticus: Secondary | ICD-10-CM

## 2018-06-01 LAB — FERRITIN: Ferritin: 22.4 ng/mL (ref 10.0–291.0)

## 2018-06-01 LAB — COMPREHENSIVE METABOLIC PANEL
ALK PHOS: 58 U/L (ref 39–117)
ALT: 9 U/L (ref 0–35)
AST: 12 U/L (ref 0–37)
Albumin: 3.8 g/dL (ref 3.5–5.2)
BILIRUBIN TOTAL: 0.4 mg/dL (ref 0.2–1.2)
BUN: 11 mg/dL (ref 6–23)
CO2: 26 meq/L (ref 19–32)
CREATININE: 0.7 mg/dL (ref 0.40–1.20)
Calcium: 9.2 mg/dL (ref 8.4–10.5)
Chloride: 104 mEq/L (ref 96–112)
GFR: 94.95 mL/min (ref >60.00)
Glucose, Bld: 98 mg/dL (ref 70–99)
POTASSIUM: 3.8 meq/L (ref 3.5–5.1)
Sodium: 140 mEq/L (ref 135–145)
Total Protein: 7.1 g/dL (ref 6.0–8.3)

## 2018-06-01 LAB — LIPID PANEL
CHOLESTEROL: 171 mg/dL (ref 0–200)
HDL: 52.7 mg/dL (ref >39.00)
LDL Cholesterol: 102 mg/dL — ABNORMAL HIGH (ref 0–99)
NonHDL: 118.41
TRIGLYCERIDES: 82 mg/dL (ref 0.0–149.0)
Total CHOL/HDL Ratio: 3
VLDL: 16.4 mg/dL (ref 0.0–40.0)

## 2018-06-01 LAB — CBC
HEMATOCRIT: 36.8 % (ref 36.0–46.0)
Hemoglobin: 12.5 g/dL (ref 12.0–15.0)
MCHC: 33.9 g/dL (ref 30.0–36.0)
MCV: 87.2 fl (ref 78.0–100.0)
Platelets: 226 10*3/uL (ref 150.0–400.0)
RBC: 4.22 Mil/uL (ref 3.87–5.11)
RDW: 12.8 % (ref 11.5–15.5)
WBC: 5.3 10*3/uL (ref 4.0–10.5)

## 2018-06-01 LAB — HEMOGLOBIN A1C: Hgb A1c MFr Bld: 5.8 % (ref 4.6–6.5)

## 2018-06-01 LAB — T4, FREE: FREE T4: 0.9 ng/dL (ref 0.60–1.60)

## 2018-06-01 LAB — TSH: TSH: 2.72 u[IU]/mL (ref 0.35–4.50)

## 2018-06-01 LAB — VITAMIN D 25 HYDROXY (VIT D DEFICIENCY, FRACTURES): VITD: 35.72 ng/mL (ref 30.00–100.00)

## 2018-06-01 LAB — VITAMIN B12: Vitamin B-12: 437 pg/mL (ref 211–911)

## 2018-06-01 NOTE — Assessment & Plan Note (Signed)
Flu shot up to date. Tetanus up to date. Mammogram up to date with gyn, pap smear up to date with gyn. Counseled about sun safety and mole surveillance. Counseled about the dangers of distracted driving. Given 10 year screening recommendations.   

## 2018-06-01 NOTE — Patient Instructions (Signed)

## 2018-06-01 NOTE — Assessment & Plan Note (Signed)
Concerned that singulair is causing fatigue and is only taking as needed now. Using flonase as well when needed.

## 2018-06-01 NOTE — Assessment & Plan Note (Signed)
Seeing neurology and stable on keppra.

## 2018-06-01 NOTE — Assessment & Plan Note (Signed)
Checking TSH and free T4. Some fatigue and weight gain currently with trending down thyroid levels.

## 2018-06-01 NOTE — Progress Notes (Signed)
   Subjective:    Patient ID: Lori [name], female    DOB: 1970-10-29, 47 y.o.   MRN: [id number]  HPI The patient is a 47 YO female coming in for physical.   PMH, [facility], social history reviewed and updated.   Review of Systems  Constitutional: Negative.   HENT: Negative.   Eyes: Negative.   Respiratory: Negative for cough, chest tightness and shortness of breath.   Cardiovascular: Negative for chest pain, palpitations and leg swelling.  Gastrointestinal: Negative for abdominal distention, abdominal pain, constipation, diarrhea, nausea and vomiting.  Musculoskeletal: Negative.   Skin: Negative.   Neurological: Negative.   Psychiatric/Behavioral: Negative.       Objective:   Physical Exam  Constitutional: She is oriented to person, place, and time. She appears well-developed and well-nourished.  HENT:  Head: Normocephalic and atraumatic.  Eyes: EOM are normal.  Neck: Normal range of motion.  Cardiovascular: Normal rate and regular rhythm.  Pulmonary/Chest: Effort normal and breath sounds normal. No respiratory distress. She has no wheezes. She has no rales.  Abdominal: Soft. Bowel sounds are normal. She exhibits no distension. There is no tenderness. There is no rebound.  Musculoskeletal: She exhibits no edema.  Neurological: She is alert and oriented to person, place, and time. Coordination normal.  Skin: Skin is warm and dry.  Psychiatric: She has a normal mood and affect.   Vitals:   06/01/18 0818  BP: 108/60  Pulse: 70  Temp: 98 F (36.7 C)  TempSrc: Oral  SpO2: 97%  Weight: 139 lb (63 kg)  Height: 5\' 3"  (1.6 m)      Assessment & Plan:

## 2018-06-10 ENCOUNTER — Encounter: Payer: Self-pay | Admitting: Internal Medicine

## 2018-06-10 NOTE — Progress Notes (Signed)
Abstracted and sent to scan  

## 2018-06-18 ENCOUNTER — Encounter: Payer: Self-pay | Admitting: Neurology

## 2018-06-18 ENCOUNTER — Ambulatory Visit: Payer: 59 | Admitting: Neurology

## 2018-06-18 ENCOUNTER — Other Ambulatory Visit: Payer: Self-pay

## 2018-06-18 VITALS — BP 112/71 | HR 90 | Resp 14 | Ht 63.0 in | Wt 140.0 lb

## 2018-06-18 DIAGNOSIS — G40309 Generalized idiopathic epilepsy and epileptic syndromes, not intractable, without status epilepticus: Secondary | ICD-10-CM | POA: Diagnosis not present

## 2018-06-18 MED ORDER — LEVETIRACETAM ER 500 MG PO TB24: ORAL_TABLET | ORAL | 3 refills | Status: DC

## 2018-06-18 NOTE — Progress Notes (Signed)
Reason for visit: Seizures  Lori [name] is an 47 y.o. female  History of present illness:  Lori [name] is a 47 year old right-handed [place] female with a history of seizures that have been well controlled on Keppra.  The patient is on the extended release Keppra 500 mg taking 2 tablets daily.  The patient has cut back to 500 mg daily in September 2019.  The patient is had a lot of allergy symptoms, she has been on antihistamine therapy and decongestant therapy, she has had some drowsiness and she wanted to cut back on some of her other medications.  The patient has not had a seizure since 2012.  The patient is operating a motor vehicle, the patient is working.  The patient returns to this office for an evaluation.  Past Medical History:  Diagnosis Date  . Seizure [facility])     History reviewed. No pertinent surgical history.  Family History  Problem Relation Age of Onset  . Diabetes Father   . Anemia Father   . Seizures Neg Hx     Social history:  reports that she has never smoked. She has never used smokeless tobacco. She reports that she does not drink alcohol or use drugs.   No Known Allergies  Medications:  Prior to Admission medications   Medication Sig Start Date End Date Taking? Authorizing Provider  cholecalciferol (VITAMIN D) 1000 UNITS tablet Take 1,000 Units by mouth daily.   Yes [provider]  estradiol (ESTRACE) 2 MG tablet  04/17/17  Yes [provider]  fluticasone (FLONASE) 50 MCG/ACT nasal spray Place 2 sprays into both nostrils daily. 11/01/16  Yes Biagio Borg, MD  levETIRAcetam (KEPPRA XR) 500 MG 24 hr tablet Take 2 every morning 06/17/17  Yes Kathrynn Ducking, MD  montelukast (SINGULAIR) 10 MG tablet Take 10 mg by mouth daily. 11/04/17  Yes [provider]  Multiple Vitamin (MULTIVITAMIN) tablet Take 1 tablet by mouth daily.   Yes [provider]    ROS:  Out of a complete 14 system review of symptoms, the patient  complains only of the following symptoms, and all other reviewed systems are negative.  Seizures Environmental allergies  Blood pressure 112/71, pulse 90, resp. rate 14, height 5\' 3"  (1.6 m), weight 140 lb (63.5 kg).  Physical Exam  General: The patient is alert and cooperative at the time of the examination.  Skin: No significant peripheral edema is noted.   Neurologic Exam  Mental status: The patient is alert and oriented x 3 at the time of the examination. The patient has apparent normal recent and remote memory, with an apparently normal attention span and concentration ability.   Cranial nerves: Facial symmetry is present. Speech is normal, no aphasia or dysarthria is noted. Extraocular movements are full. Visual fields are full.  Motor: The patient has good strength in all 4 extremities.  Sensory examination: Soft touch sensation is symmetric on the face, arms, and legs.  Coordination: The patient has good finger-nose-finger and heel-to-shin bilaterally.  Gait and station: The patient has a normal gait. Tandem gait is normal. Romberg is negative. No drift is seen.  Reflexes: Deep tendon reflexes are symmetric.   Assessment/Plan:  1.  History of seizures  The patient is encouraged to go back to her 1000 mg daily dose of Keppra.  She has done well on this dose, the concurrent use of antihistamines may slightly lower the seizure threshold as well.  The patient was given a  prescription for her medication, she will follow-up in 1 year, sooner if needed.  Jill Alexanders MD 06/18/2018 8:07 AM  [facility] [address] [place], [place] [address]  Phone [phone] Fax [phone]

## 2018-06-22 ENCOUNTER — Telehealth: Payer: 59 | Admitting: Family

## 2018-06-22 DIAGNOSIS — J019 Acute sinusitis, unspecified: Secondary | ICD-10-CM

## 2018-06-22 MED ORDER — AMOXICILLIN-POT CLAVULANATE 875-125 MG PO TABS: 1.0000 | ORAL_TABLET | Freq: Two times a day (BID) | ORAL | 0 refills | Status: DC

## 2018-06-22 MED FILL — AMOX-CLAV 875-125 MG TABLET: 875-125 | 7 days supply | Qty: 14 | Fill #0

## 2018-06-22 NOTE — Progress Notes (Signed)

## 2018-06-25 MED FILL — MONTELUKAST SOD 10 MG TAB: 10 | 30 days supply | Qty: 30 | Fill #2

## 2018-07-07 MED FILL — ESTRADIOL 2 MG TABLET: 2 | 90 days supply | Qty: 90 | Fill #0

## 2018-07-13 MED FILL — LEVETIRACETAM ER 500 MG TAB: 500 | 90 days supply | Qty: 180 | Fill #0

## 2018-07-23 MED FILL — MONTELUKAST SOD 10 MG TAB: 10 | 30 days supply | Qty: 30 | Fill #3

## 2018-09-04 MED FILL — MONTELUKAST SOD 10 MG TAB: 10 | 30 days supply | Qty: 30 | Fill #4

## 2018-10-02 ENCOUNTER — Ambulatory Visit (INDEPENDENT_AMBULATORY_CARE_PROVIDER_SITE_OTHER): Payer: No Typology Code available for payment source | Admitting: Internal Medicine

## 2018-10-02 ENCOUNTER — Encounter: Payer: Self-pay | Admitting: Internal Medicine

## 2018-10-02 DIAGNOSIS — J302 Other seasonal allergic rhinitis: Secondary | ICD-10-CM

## 2018-10-02 NOTE — Progress Notes (Signed)
   Subjective:   Patient ID: Lori [name], female    DOB: February 22, 1971, 48 y.o.   MRN: [id number]  HPI The patient is a 48 y.o. female coming in for sinus symptoms. Started about 1-2 weeks ago. Main symptoms are: dizziness which lasts a few seconds of mild room moving, sinus pressure with headache in the last 2 days, post nasal drip, worsening GERD. Denies chest pains or cough or fevers or chills or bodyaches. Overall it is worsening. Has tried singulair and zyrtec but not taking flonase now.   Review of Systems  Constitutional: Negative for activity change, appetite change, chills, fatigue, fever and unexpected weight change.  HENT: Positive for congestion, postnasal drip, rhinorrhea and sinus pressure. Negative for ear discharge, ear pain, sinus pain, sneezing, sore throat, tinnitus, trouble swallowing and voice change.   Eyes: Negative.   Respiratory: Negative for cough, chest tightness, shortness of breath and wheezing.   Cardiovascular: Negative.   Gastrointestinal: Negative.        GERD  Neurological: Negative.     Objective:  Physical Exam Constitutional:      Appearance: She is well-developed.  HENT:     Head: Normocephalic and atraumatic.     Comments: Oropharynx with redness and clear drainage, nose with swollen turbinates, TMs normal bilaterally.  Neck:     Musculoskeletal: Normal range of motion.     Thyroid: No thyromegaly.  Cardiovascular:     Rate and Rhythm: Normal rate and regular rhythm.  Pulmonary:     Effort: Pulmonary effort is normal. No respiratory distress.     Breath sounds: Normal breath sounds. No wheezing or rales.  Abdominal:     Palpations: Abdomen is soft.  Lymphadenopathy:     Cervical: No cervical adenopathy.  Skin:    General: Skin is warm and dry.  Neurological:     Mental Status: She is alert and oriented to person, place, and time.     Vitals:   10/02/18 1322  BP: 110/80  Pulse: (!) 57  Temp: 98.1 F (36.7 C)  TempSrc: Oral    SpO2: 99%  Weight: 142 lb (64.4 kg)  Height: 5\' 3"  (1.6 m)    Assessment & Plan:

## 2018-10-02 NOTE — Patient Instructions (Signed)
I would recommend doing the flonase for a few days.  You can use the ear drops 3 drops in the ear 3 times a day for 3 days.

## 2018-10-02 NOTE — Assessment & Plan Note (Signed)
No evidence for sinusitis. Continue singulair and zyrtec and add flonase.

## 2018-10-07 MED FILL — MONTELUKAST SOD 10 MG TAB: 10 | 30 days supply | Qty: 30 | Fill #5

## 2018-10-14 MED FILL — ESTRADIOL 2 MG TABS: 2 | 90 days supply | Qty: 90 | Fill #1

## 2018-11-02 MED FILL — MONTELUKAST SOD 10 MG TAB: 10 | 30 days supply | Qty: 30 | Fill #6

## 2018-11-09 MED FILL — LEVETIRACETAM ER 500 MG TAB: 500 | 90 days supply | Qty: 180 | Fill #1

## 2018-12-02 MED FILL — MONTELUKAST SOD 10 MG TAB: 10 | 90 days supply | Qty: 90 | Fill #0

## 2019-01-13 MED FILL — ESTRADIOL 2 MG TABS: 2 | 90 days supply | Qty: 90 | Fill #0

## 2019-02-10 MED FILL — LEVETIRACETAM ER 500 MG TAB: 500 | 90 days supply | Qty: 180 | Fill #2

## 2019-02-15 ENCOUNTER — Encounter: Payer: Self-pay | Admitting: Internal Medicine

## 2019-03-17 MED FILL — FLUTICASONE PROP 50 MCG SPR: 50 | 30 days supply | Qty: 16 | Fill #0

## 2019-03-17 MED FILL — AZELASTINE HCL 0.05% DROPS: 0.05 | 30 days supply | Qty: 6 | Fill #0

## 2019-03-17 MED FILL — MONTELUKAST SOD 10 MG TAB: 10 | 30 days supply | Qty: 30 | Fill #0

## 2019-03-19 MED FILL — EPINEPHRINE 0.3 MG AUTO-INJ: 0.3 | 2 days supply | Qty: 2 | Fill #0

## 2019-04-06 ENCOUNTER — Telehealth: Payer: Self-pay

## 2019-04-06 ENCOUNTER — Encounter: Payer: Self-pay | Admitting: Internal Medicine

## 2019-04-06 NOTE — Telephone Encounter (Signed)
Replied to patient via mychart with advice.

## 2019-04-06 NOTE — Telephone Encounter (Signed)
Patient started allergy shots about one week ago---and ever since has experienced slight pressure in the orbital region of her face with a mild headache, she has already tried tylenol,advil and OTC sinus medicine that will relieve pressure/headache for a little while but then it will return---Note: she is carrying epi-pen just in case of allergic reaction)----please advise of any other recommendations you have, I will call patient back---phone number for her today is [phone]

## 2019-04-13 ENCOUNTER — Encounter: Payer: Self-pay | Admitting: Internal Medicine

## 2019-04-14 MED FILL — MONTELUKAST SOD 10 MG TAB: 10 | 90 days supply | Qty: 90 | Fill #1

## 2019-04-16 ENCOUNTER — Other Ambulatory Visit: Payer: Self-pay

## 2019-04-16 ENCOUNTER — Ambulatory Visit (INDEPENDENT_AMBULATORY_CARE_PROVIDER_SITE_OTHER): Payer: No Typology Code available for payment source | Admitting: Internal Medicine

## 2019-04-16 ENCOUNTER — Encounter: Payer: Self-pay | Admitting: Internal Medicine

## 2019-04-16 VITALS — BP 120/70 | HR 71 | Temp 98.6°F | Ht 63.0 in | Wt 140.0 lb

## 2019-04-16 DIAGNOSIS — G4489 Other headache syndrome: Secondary | ICD-10-CM | POA: Diagnosis not present

## 2019-04-16 MED ORDER — AMOXICILLIN-POT CLAVULANATE 875-125 MG PO TABS: 1.0000 | ORAL_TABLET | Freq: Two times a day (BID) | ORAL | 0 refills | Status: DC

## 2019-04-16 MED FILL — AMOX-CLAV 875-125 MG TABLET: 875-125 | 10 days supply | Qty: 20 | Fill #0

## 2019-04-16 NOTE — Assessment & Plan Note (Signed)
Rx augmentin to see if she has sinus infection. Could be related to recent start of allergy shots.

## 2019-04-16 NOTE — Patient Instructions (Signed)
We will treat you for sinus infection with augmentin to take 1 pill twice a day for 10 days.

## 2019-04-16 NOTE — Progress Notes (Signed)
   Subjective:   Patient ID: Lori [name], female    DOB: 1970-10-12, 48 y.o.   MRN: [id number]  HPI The patient is a 48 YO female coming in for ongoing headaches. Started about 2 weeks ago with starting new allergy injections. She did talk to her allergist but they feel this is not related. The headache is centered around eyes left worse than right. Relieved with benadryl and antihistamines. She is taking singulair and using benadryl currently. She was getting side effects from claritin so stopped this. Uses flonase as well. Denies fevers or chills. Works in [employer]. Denies stomach problems or nausea. Overall is worsening.   Review of Systems  Constitutional: Negative.   HENT: Positive for congestion, sinus pressure and sinus pain. Negative for ear discharge, ear pain, sneezing, sore throat, tinnitus, trouble swallowing and voice change.   Eyes: Negative.   Respiratory: Negative for cough, chest tightness and shortness of breath.   Cardiovascular: Negative for chest pain, palpitations and leg swelling.  Gastrointestinal: Negative for abdominal distention, abdominal pain, constipation, diarrhea, nausea and vomiting.  Musculoskeletal: Negative.   Skin: Negative.   Neurological: Positive for headaches.  Psychiatric/Behavioral: Negative.     Objective:  Physical Exam Constitutional:      Appearance: She is well-developed.  HENT:     Head: Normocephalic and atraumatic.     Comments: Frontal sinus pain to palpation, no temporal tenderness and gets some relief with pressure to temples or sinuses, nose without crusting, oropharynx with mild drainage Neck:     Musculoskeletal: Normal range of motion.  Cardiovascular:     Rate and Rhythm: Normal rate and regular rhythm.  Pulmonary:     Effort: Pulmonary effort is normal. No respiratory distress.     Breath sounds: Normal breath sounds. No wheezing or rales.  Abdominal:     General: Bowel sounds are normal. There is no distension.   Palpations: Abdomen is soft.     Tenderness: There is no abdominal tenderness. There is no rebound.  Skin:    General: Skin is warm and dry.  Neurological:     Mental Status: She is alert and oriented to person, place, and time.     Coordination: Coordination normal.     Vitals:   04/16/19 1121  BP: 120/70  Pulse: 71  Temp: 98.6 F (37 C)  TempSrc: Oral  SpO2: 98%  Weight: 140 lb (63.5 kg)  Height: 5\' 3"  (1.6 m)    Assessment & Plan:

## 2019-05-14 MED FILL — LEVETIRACETAM ER 500 MG TAB: 500 | 90 days supply | Qty: 180 | Fill #3

## 2019-06-02 ENCOUNTER — Ambulatory Visit (INDEPENDENT_AMBULATORY_CARE_PROVIDER_SITE_OTHER): Payer: No Typology Code available for payment source | Admitting: Internal Medicine

## 2019-06-02 ENCOUNTER — Other Ambulatory Visit: Payer: Self-pay

## 2019-06-02 ENCOUNTER — Other Ambulatory Visit (INDEPENDENT_AMBULATORY_CARE_PROVIDER_SITE_OTHER): Payer: No Typology Code available for payment source

## 2019-06-02 ENCOUNTER — Encounter: Payer: Self-pay | Admitting: Internal Medicine

## 2019-06-02 VITALS — BP 116/78 | HR 80 | Temp 98.6°F | Ht 63.0 in | Wt 141.0 lb

## 2019-06-02 DIAGNOSIS — E061 Subacute thyroiditis: Secondary | ICD-10-CM | POA: Diagnosis not present

## 2019-06-02 DIAGNOSIS — Z0000 Encounter for general adult medical examination without abnormal findings: Secondary | ICD-10-CM

## 2019-06-02 DIAGNOSIS — G40309 Generalized idiopathic epilepsy and epileptic syndromes, not intractable, without status epilepticus: Secondary | ICD-10-CM

## 2019-06-02 LAB — TSH: TSH: 2.72 u[IU]/mL (ref 0.35–4.50)

## 2019-06-02 LAB — COMPREHENSIVE METABOLIC PANEL
ALT: 12 U/L (ref 0–35)
AST: 17 U/L (ref 0–37)
Albumin: 4.2 g/dL (ref 3.5–5.2)
Alkaline Phosphatase: 69 U/L (ref 39–117)
BUN: 7 mg/dL (ref 6–23)
CO2: 27 mEq/L (ref 19–32)
Calcium: 9.3 mg/dL (ref 8.4–10.5)
Chloride: 104 mEq/L (ref 96–112)
Creatinine, Ser: 0.67 mg/dL (ref 0.40–1.20)
GFR: 93.58 mL/min (ref >60.00)
Glucose, Bld: 102 mg/dL — ABNORMAL HIGH (ref 70–99)
Potassium: 3.9 mEq/L (ref 3.5–5.1)
Sodium: 139 mEq/L (ref 135–145)
Total Bilirubin: 0.5 mg/dL (ref 0.2–1.2)
Total Protein: 7.7 g/dL (ref 6.0–8.3)

## 2019-06-02 LAB — HEMOGLOBIN A1C: Hgb A1c MFr Bld: 6 % (ref 4.6–6.5)

## 2019-06-02 LAB — URINALYSIS, ROUTINE W REFLEX MICROSCOPIC
Bilirubin Urine: NEGATIVE
Hgb urine dipstick: NEGATIVE
Ketones, ur: NEGATIVE
Nitrite: NEGATIVE
Specific Gravity, Urine: 1.02 (ref 1.000–1.030)
Total Protein, Urine: NEGATIVE
Urine Glucose: NEGATIVE
Urobilinogen, UA: 0.2 (ref 0.0–1.0)
pH: 5.5 (ref 5.0–8.0)

## 2019-06-02 LAB — LIPID PANEL
Cholesterol: 185 mg/dL (ref 0–200)
HDL: 52.7 mg/dL (ref >39.00)
LDL Cholesterol: 118 mg/dL — ABNORMAL HIGH (ref 0–99)
NonHDL: 132.61
Total CHOL/HDL Ratio: 4
Triglycerides: 72 mg/dL (ref 0.0–149.0)
VLDL: 14.4 mg/dL (ref 0.0–40.0)

## 2019-06-02 LAB — CBC
HCT: 40.7 % (ref 36.0–46.0)
Hemoglobin: 13.3 g/dL (ref 12.0–15.0)
MCHC: 32.6 g/dL (ref 30.0–36.0)
MCV: 88.6 fl (ref 78.0–100.0)
Platelets: 234 10*3/uL (ref 150.0–400.0)
RBC: 4.6 Mil/uL (ref 3.87–5.11)
RDW: 12.8 % (ref 11.5–15.5)
WBC: 6.7 10*3/uL (ref 4.0–10.5)

## 2019-06-02 LAB — MAGNESIUM: Magnesium: 1.7 mg/dL (ref 1.5–2.5)

## 2019-06-02 LAB — CK: Total CK: 150 U/L (ref 7–177)

## 2019-06-02 LAB — VITAMIN B12: Vitamin B-12: 322 pg/mL (ref 211–911)

## 2019-06-02 LAB — VITAMIN D 25 HYDROXY (VIT D DEFICIENCY, FRACTURES): VITD: 25.31 ng/mL — ABNORMAL LOW (ref 30.00–100.00)

## 2019-06-02 NOTE — Patient Instructions (Signed)
Hip Bursitis Rehab Ask your health care provider which exercises are safe for you. Do exercises exactly as told by your health care provider and adjust them as directed. It is normal to feel mild stretching, pulling, tightness, or discomfort as you do these exercises. Stop right away if you feel sudden pain or your pain gets worse. Do not begin these exercises until told by your health care provider. Stretching exercise This exercise warms up your muscles and joints and improves the movement and flexibility of your hip. This exercise also helps to relieve pain and stiffness. Iliotibial band stretch An iliotibial band is a strong band of muscle tissue that runs from the outer side of your hip to the outer side of your thigh and knee. 1. Lie on your side with your left / right leg in the top position. 2. Bend your left / right knee and grab your ankle. Stretch out your bottom arm to help you balance. 3. Slowly bring your knee back so your thigh is behind your body. 4. Slowly lower your knee toward the floor until you feel a gentle stretch on the outside of your left / right thigh. If you do not feel a stretch and your knee will not fall farther, place the heel of your other foot on top of your knee and pull your knee down toward the floor with your foot. 5. Hold this position for __________ seconds. 6. Slowly return to the starting position. Repeat __________ times. Complete this exercise __________ times a day. Strengthening exercises These exercises build strength and endurance in your hip and pelvis. Endurance is the ability to use your muscles for a long time, even after they get tired. Bridge This exercise strengthens the muscles that move your thigh backward (hip extensors). 1. Lie on your back on a firm surface with your knees bent and your feet flat on the floor. 2. Tighten your buttocks muscles and lift your buttocks off the floor until your trunk is level with your thighs. ? Do not arch  your back. ? You should feel the muscles working in your buttocks and the back of your thighs. If you do not feel these muscles, slide your feet 1-2 inches (2.5-5 cm) farther away from your buttocks. ? If this exercise is too easy, try doing it with your arms crossed over your chest. 3. Hold this position for __________ seconds. 4. Slowly lower your hips to the starting position. 5. Let your muscles relax completely after each repetition. Repeat __________ times. Complete this exercise __________ times a day. Squats This exercise strengthens the muscles in front of your thigh and knee (quadriceps). 1. Stand in front of a table, with your feet and knees pointing straight ahead. You may rest your hands on the table for balance but not for support. 2. Slowly bend your knees and lower your hips like you are going to sit in a chair. ? Keep your weight over your heels, not over your toes. ? Keep your lower legs upright so they are parallel with the table legs. ? Do not let your hips go lower than your knees. ? Do not bend lower than told by your health care provider. ? If your hip pain increases, do not bend as low. 3. Hold the squat position for __________ seconds. 4. Slowly push with your legs to return to standing. Do not use your hands to pull yourself to standing. Repeat __________ times. Complete this exercise __________ times a day. Hip hike 1. Stand   sideways on a bottom step. Stand on your left / right leg with your other foot unsupported next to the step. You can hold on to the railing or wall for balance if needed. 2. Keep your knees straight and your torso square. Then lift your left / right hip up toward the ceiling. 3. Hold this position for __________ seconds. 4. Slowly let your left / right hip lower toward the floor, past the starting position. Your foot should get closer to the floor. Do not lean or bend your knees. Repeat __________ times. Complete this exercise __________ times a  day. Single leg stand 1. Without shoes, stand near a railing or in a doorway. You may hold on to the railing or door frame as needed for balance. 2. Squeeze your left / right buttock muscles, then lift up your other foot. ? Do not let your left / right hip push out to the side. ? It is helpful to stand in front of a mirror for this exercise so you can watch your hip. 3. Hold this position for __________ seconds. Repeat __________ times. Complete this exercise __________ times a day. This information is not intended to replace advice given to you by your health care provider. Make sure you discuss any questions you have with your health care provider. Document Released: 09/05/2004 Document Revised: 11/23/2018 Document Reviewed: 11/23/2018 Elsevier Patient Education  2020 [place] Maintenance, Female Adopting a healthy lifestyle and getting preventive care are important in promoting health and wellness. Ask your health care provider about:  The right schedule for you to have regular tests and exams.  Things you can do on your own to prevent diseases and keep yourself healthy. What should I know about diet, weight, and exercise? Eat a healthy diet   Eat a diet that includes plenty of vegetables, fruits, low-fat dairy products, and lean protein.  Do not eat a lot of foods that are high in solid fats, added sugars, or sodium. Maintain a healthy weight Body mass index (BMI) is used to identify weight problems. It estimates body fat based on height and weight. Your health care provider can help determine your BMI and help you achieve or maintain a healthy weight. Get regular exercise Get regular exercise. This is one of the most important things you can do for your health. Most adults should:  Exercise for at least 150 minutes each week. The exercise should increase your heart rate and make you sweat (moderate-intensity exercise).  Do strengthening exercises at least twice a  week. This is in addition to the moderate-intensity exercise.  Spend less time sitting. Even light physical activity can be beneficial. Watch cholesterol and blood lipids Have your blood tested for lipids and cholesterol at 49 years of age, then have this test every 5 years. Have your cholesterol levels checked more often if:  Your lipid or cholesterol levels are high.  You are older than 49 years of age.  You are at high risk for heart disease. What should I know about cancer screening? Depending on your health history and family history, you may need to have cancer screening at various ages. This may include screening for:  Breast cancer.  Cervical cancer.  Colorectal cancer.  Skin cancer.  Lung cancer. What should I know about heart disease, diabetes, and high blood pressure? Blood pressure and heart disease  High blood pressure causes heart disease and increases the risk of stroke. This is more likely to develop in people  who have high blood pressure readings, are of African descent, or are overweight.  Have your blood pressure checked: ? Every 3-5 years if you are 36-49 years of age. ? Every year if you are 4 years old or older. Diabetes Have regular diabetes screenings. This checks your fasting blood sugar level. Have the screening done:  Once every three years after age 29 if you are at a normal weight and have a low risk for diabetes.  More often and at a younger age if you are overweight or have a high risk for diabetes. What should I know about preventing infection? Hepatitis B If you have a higher risk for hepatitis B, you should be screened for this virus. Talk with your health care provider to find out if you are at risk for hepatitis B infection. Hepatitis C Testing is recommended for:  Everyone born from [birth year] through 1966.  Anyone with known risk factors for hepatitis C. Sexually transmitted infections (STIs)  Get screened for STIs, including gonorrhea  and chlamydia, if: ? You are sexually active and are younger than 49 years of age. ? You are older than 49 years of age and your health care provider tells you that you are at risk for this type of infection. ? Your sexual activity has changed since you were last screened, and you are at increased risk for chlamydia or gonorrhea. Ask your health care provider if you are at risk.  Ask your health care provider about whether you are at high risk for HIV. Your health care provider may recommend a prescription medicine to help prevent HIV infection. If you choose to take medicine to prevent HIV, you should first get tested for HIV. You should then be tested every 3 months for as long as you are taking the medicine. Pregnancy  If you are about to stop having your period (premenopausal) and you may become pregnant, seek counseling before you get pregnant.  Take 400 to 800 micrograms (mcg) of folic acid every day if you become pregnant.  Ask for birth control (contraception) if you want to prevent pregnancy. Osteoporosis and menopause Osteoporosis is a disease in which the bones lose minerals and strength with aging. This can result in bone fractures. If you are 58 years old or older, or if you are at risk for osteoporosis and fractures, ask your health care provider if you should:  Be screened for bone loss.  Take a calcium or vitamin D supplement to lower your risk of fractures.  Be given hormone replacement therapy (HRT) to treat symptoms of menopause. Follow these instructions at home: Lifestyle  Do not use any products that contain nicotine or tobacco, such as cigarettes, e-cigarettes, and chewing tobacco. If you need help quitting, ask your health care provider.  Do not use street drugs.  Do not share needles.  Ask your health care provider for help if you need support or information about quitting drugs. Alcohol use  Do not drink alcohol if: ? Your health care provider tells you not  to drink. ? You are pregnant, may be pregnant, or are planning to become pregnant.  If you drink alcohol: ? Limit how much you use to 0-1 drink a day. ? Limit intake if you are breastfeeding.  Be aware of how much alcohol is in your drink. In the [place] one drink equals one 12 oz bottle of beer (355 mL), one 5 oz glass of wine (148 mL), or one 1 oz glass of hard liquor (  44 mL). General instructions  Schedule regular health, dental, and eye exams.  Stay current with your vaccines.  Tell your health care provider if: ? You often feel depressed. ? You have ever been abused or do not feel safe at home. Summary  Adopting a healthy lifestyle and getting preventive care are important in promoting health and wellness.  Follow your health care provider's instructions about healthy diet, exercising, and getting tested or screened for diseases.  Follow your health care provider's instructions on monitoring your cholesterol and blood pressure. This information is not intended to replace advice given to you by your health care provider. Make sure you discuss any questions you have with your health care provider. Document Released: 02/11/2011 Document Revised: 07/22/2018 Document Reviewed: 07/22/2018 Elsevier Patient Education  2020 [employer].

## 2019-06-02 NOTE — Assessment & Plan Note (Signed)
Flu shot up to date. Tetanus up to date. Mammogram up to date, pap smear up to date. Counseled about sun safety and mole surveillance. Counseled about the dangers of distracted driving. Given 10 year screening recommendations.

## 2019-06-02 NOTE — Assessment & Plan Note (Signed)
Checking TSH and free T4.

## 2019-06-02 NOTE — Progress Notes (Signed)
   Subjective:   Patient ID: Lori [name], female    DOB: [birth year], 48 y.o.   MRN: [id number]  HPI The patient is a 48 YO female coming in for physical.   PMH, [place], social history reviewed and updated  Review of Systems  Constitutional: Negative.   HENT: Negative.   Eyes: Negative.   Respiratory: Negative for cough, chest tightness and shortness of breath.   Cardiovascular: Negative for chest pain, palpitations and leg swelling.  Gastrointestinal: Negative for abdominal distention, abdominal pain, constipation, diarrhea, nausea and vomiting.  Endocrine: Positive for heat intolerance.  Musculoskeletal: Positive for arthralgias and myalgias.  Skin: Negative.   Neurological: Negative.   Psychiatric/Behavioral: Negative.     Objective:  Physical Exam Constitutional:      Appearance: She is well-developed.  HENT:     Head: Normocephalic and atraumatic.  Neck:     Musculoskeletal: Normal range of motion.  Cardiovascular:     Rate and Rhythm: Normal rate and regular rhythm.  Pulmonary:     Effort: Pulmonary effort is normal. No respiratory distress.     Breath sounds: Normal breath sounds. No wheezing or rales.  Abdominal:     General: Bowel sounds are normal. There is no distension.     Palpations: Abdomen is soft.     Tenderness: There is no abdominal tenderness. There is no rebound.  Musculoskeletal:        General: No swelling or deformity.  Skin:    General: Skin is warm and dry.  Neurological:     Mental Status: She is alert and oriented to person, place, and time.     Coordination: Coordination normal.     Vitals:   06/02/19 0804  BP: 116/78  Pulse: 80  Temp: 98.6 F (37 C)  TempSrc: Oral  SpO2: 97%  Weight: 141 lb (64 kg)  Height: 5\' 3"  (1.6 m)    Assessment & Plan:

## 2019-06-02 NOTE — Assessment & Plan Note (Signed)
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

## 2019-06-04 LAB — ANA, IFA COMPREHENSIVE PANEL
Anti Nuclear Antibody (ANA): POSITIVE — AB
ENA SM Ab Ser-aCnc: <1 AI
SM/RNP: <1 AI
SSA (Ro) (ENA) Antibody, IgG: <1 AI
SSB (La) (ENA) Antibody, IgG: <1 AI
Scleroderma (Scl-70) (ENA) Antibody, IgG: <1 AI
ds DNA Ab: 1 IU/mL

## 2019-06-04 LAB — RHEUMATOID FACTOR: Rheumatoid fact SerPl-aCnc: <14 IU/mL (ref <14)

## 2019-06-04 LAB — ANTI-NUCLEAR AB-TITER (ANA TITER): ANA Titer 1: 1:40 {titer} — ABNORMAL HIGH

## 2019-06-23 NOTE — Progress Notes (Signed)
PATIENT: Lori [name] DOB: 11-27-1970  REASON FOR VISIT: follow up HISTORY FROM: patient  HISTORY OF PRESENT ILLNESS: Today 06/24/19  Lori [name] is a 48 year old female with history of seizures that have been well controlled on Keppra.  She is taking extended release Keppra 500 mg, in September 2019, she decreased her dose to 500 mg daily, but her higher maintenance dose was increased after last visit to 1000 mg daily.  Her last seizure occurred in 2012.  She has not had recurrent seizure.  She is tolerating Keppra without side effect.  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  She drives a car without difficulty.  She denies any trouble with her mood, balance, or gait.  She denies any new problems or concerns.  She does have chronic allergies.  She presents today for follow-up unaccompanied.  HISTORY 06/18/2018 Dr. Jannifer Franklin: Lori [name] is a 48 year old right-handed [place] female with a history of seizures that have been well controlled on Keppra.  The patient is on the extended release Keppra 500 mg taking 2 tablets daily.  The patient has cut back to 500 mg daily in September 2019.  The patient is had a lot of allergy symptoms, she has been on antihistamine therapy and decongestant therapy, she has had some drowsiness and she wanted to cut back on some of her other medications.  The patient has not had a seizure since 2012.  The patient is operating a motor vehicle, the patient is working.  The patient returns to this office for an evaluation.  REVIEW OF SYSTEMS: Out of a complete 14 system review of symptoms, the patient complains only of the following symptoms, and all other reviewed systems are negative.  Seizure  ALLERGIES: No Known Allergies  HOME MEDICATIONS: Outpatient Medications Prior to Visit  Medication Sig Dispense Refill  . cholecalciferol (VITAMIN D) 1000 UNITS tablet Take 1,000 Units by mouth daily.    . fluticasone (FLONASE) 50 MCG/ACT nasal spray Place 2 sprays  into both nostrils daily. 16 g 6  . levETIRAcetam (KEPPRA XR) 500 MG 24 hr tablet Take 2 every morning 180 tablet 3  . montelukast (SINGULAIR) 10 MG tablet Take 10 mg by mouth daily.  3  . Multiple Vitamin (MULTIVITAMIN) tablet Take 1 tablet by mouth daily.     No facility-administered medications prior to visit.     PAST MEDICAL HISTORY: Past Medical History:  Diagnosis Date  . Seizure ([place])     PAST SURGICAL HISTORY: No past surgical history on file.  FAMILY HISTORY: Family History  Problem Relation Age of Onset  . Diabetes Father   . Anemia Father   . Seizures Neg Hx     SOCIAL HISTORY: Social History   Socioeconomic History  . Marital status: Married    Spouse name: Not on file  . Number of children: 2  . Years of education: Not on file  . Highest education level: Not on file  Occupational History    Employer: [employer]  . Financial resource strain: Not on file  . Food insecurity    Worry: Not on file    Inability: Not on file  . Transportation needs    Medical: Not on file    Non-medical: Not on file  Tobacco Use  . Smoking status: Never Smoker  . Smokeless tobacco: Never Used  Substance and Sexual Activity  . Alcohol use: No  . Drug use: No  . Sexual activity: Not on file  Lifestyle  . Physical activity    Days per week: Not on file    Minutes per session: Not on file  . Stress: Not on file  Relationships  . Social Herbalist on phone: Not on file    Gets together: Not on file    Attends religious service: Not on file    Active member of club or organization: Not on file    Attends meetings of clubs or organizations: Not on file    Relationship status: Not on file  . Intimate partner violence    Fear of current or ex partner: Not on file    Emotionally abused: Not on file    Physically abused: Not on file    Forced sexual activity: Not on file  Other Topics Concern  . Not on file  Social History Narrative    Patient is married and has 2 children.    Right-handed   Caffeine: 1 1/2 cups of tea per day      PHYSICAL EXAM  Vitals:   06/24/19 0730  BP: 106/66  Pulse: 66  Temp: 98.2 F (36.8 C)  Weight: 140 lb 12.8 oz (63.9 kg)  Height: '5\' 3"'$  (1.6 m)   Body mass index is 24.94 kg/m.  Generalized: Well developed, in no acute distress   Neurological examination  Mentation: Alert oriented to time, place, history taking. Follows all commands speech and language fluent Cranial nerve II-XII: Pupils were equal round reactive to light. Extraocular movements were full, visual field were full on confrontational test. Facial sensation and strength were normal. Head turning and shoulder shrug  were normal and symmetric. Motor: The motor testing reveals 5 over 5 strength of all 4 extremities. Good symmetric motor tone is noted throughout.  Sensory: Sensory testing is intact to soft touch on all 4 extremities. No evidence of extinction is noted.  Coordination: Cerebellar testing reveals good finger-nose-finger and heel-to-shin bilaterally.  Gait and station: Gait is normal. Tandem gait is normal. Romberg is negative. No drift is seen.  Reflexes: Deep tendon reflexes are symmetric and normal bilaterally.   DIAGNOSTIC DATA (LABS, IMAGING, TESTING) - I reviewed patient records, labs, notes, testing and imaging myself where available.  Lab Results  Component Value Date   WBC 6.7 06/02/2019   HGB 13.3 06/02/2019   HCT 40.7 06/02/2019   MCV 88.6 06/02/2019   PLT 234.0 06/02/2019      Component Value Date/Time   NA 139 06/02/2019 0840   K 3.9 06/02/2019 0840   CL 104 06/02/2019 0840   CO2 27 06/02/2019 0840   GLUCOSE 102 (H) 06/02/2019 0840   BUN 7 06/02/2019 0840   CREATININE 0.67 06/02/2019 0840   CALCIUM 9.3 06/02/2019 0840   PROT 7.7 06/02/2019 0840   ALBUMIN 4.2 06/02/2019 0840   AST 17 06/02/2019 0840   ALT 12 06/02/2019 0840   ALKPHOS 69 06/02/2019 0840   BILITOT 0.5 06/02/2019 0840    GFRNONAA >60 09/21/2010 0630   GFRAA  09/21/2010 0630    >60        The eGFR has been calculated using the MDRD equation. This calculation has not been validated in all clinical situations. eGFR's persistently <60 mL/min signify possible Chronic Kidney Disease.   Lab Results  Component Value Date   CHOL 185 06/02/2019   HDL 52.70 06/02/2019   LDLCALC 118 (H) 06/02/2019   TRIG 72.0 06/02/2019   CHOLHDL 4 06/02/2019   Lab Results  Component Value Date  HGBA1C 6.0 06/02/2019   Lab Results  Component Value Date   VITAMINB12 322 06/02/2019   Lab Results  Component Value Date   TSH 2.72 06/02/2019    ASSESSMENT AND PLAN 48 y.o. year old female  has a past medical history of Seizure ([place]). here with:  1.  History of seizure  She has continued to do well.  She has not recurrent seizure since 2012.  She will remain on Keppra XR 500 mg tablet, 2 tablets in the morning.  She will follow-up in 1 year or sooner if needed.  She will call for recurrent seizure.  Her medication was refilled for 1 year.  I spent 15 minutes with the patient. 50% of this time was spent discussing her plan of care.  Butler Denmark, AGNP-C, DNP 06/24/2019, 7:37 AM [facility] [address], [place], [place] [address] [phone]

## 2019-06-24 ENCOUNTER — Encounter: Payer: Self-pay | Admitting: Neurology

## 2019-06-24 ENCOUNTER — Ambulatory Visit: Payer: No Typology Code available for payment source | Admitting: Neurology

## 2019-06-24 ENCOUNTER — Other Ambulatory Visit: Payer: Self-pay

## 2019-06-24 ENCOUNTER — Ambulatory Visit: Payer: 59 | Admitting: Neurology

## 2019-06-24 VITALS — BP 106/66 | HR 66 | Temp 98.2°F | Ht 63.0 in | Wt 140.8 lb

## 2019-06-24 DIAGNOSIS — G40309 Generalized idiopathic epilepsy and epileptic syndromes, not intractable, without status epilepticus: Secondary | ICD-10-CM

## 2019-06-24 MED ORDER — LEVETIRACETAM ER 500 MG PO TB24: ORAL_TABLET | ORAL | 3 refills | Status: DC

## 2019-06-24 NOTE — Progress Notes (Signed)
I have read the note, and I agree with the clinical assessment and plan.  Charles K Willis   

## 2019-06-24 NOTE — Patient Instructions (Signed)
Happy early birthday! You look great today! Continue Keppra. Return in 1 year.

## 2019-06-29 ENCOUNTER — Encounter: Payer: Self-pay | Admitting: Internal Medicine

## 2019-06-29 DIAGNOSIS — R768 Other specified abnormal immunological findings in serum: Secondary | ICD-10-CM

## 2019-06-29 DIAGNOSIS — M79641 Pain in right hand: Secondary | ICD-10-CM

## 2019-07-05 NOTE — Progress Notes (Signed)
[facility] [address], [place] [address] Phone: [phone] Subjective:   Lori [name], am serving as a scribe for Dr. Hulan Saas.  I'm seeing this patient by the request  of:   Hoyt Koch, MD   This visit occurred during the SARS-CoV-2 public health emergency.  Safety protocols were in place, including screening questions prior to the visit, additional usage of staff PPE, and extensive cleaning of exam room while observing appropriate contact time as indicated for disinfecting solutions.     CC: Right hand pain  [id number]  Lori [name] is a 48 y.o. female coming in with complaint of 5th finger pain for 2 months over the lateral aspect of PIP. Notices increase in pain with use. Has pain with burning sensation that goes away immediately. Also complains of stiffness in her hands in the morning.     Past Medical History:  Diagnosis Date  . Seizure ([place])    [name] past surgical history on file. Social History   Socioeconomic History  . Marital status: Married    Spouse name: Not on file  . Number of children: 2  . Years of education: Not on file  . Highest education level: Not on file  Occupational History    Employer: [employer]  . Financial resource strain: Not on file  . Food insecurity    Worry: Not on file    Inability: Not on file  . Transportation needs    Medical: Not on file    Non-medical: Not on file  Tobacco Use  . Smoking status: Never Smoker  . Smokeless tobacco: Never Used  Substance and Sexual Activity  . Alcohol use: [name]  . Drug use: [name]  . Sexual activity: Not on file  Lifestyle  . Physical activity    Days per week: Not on file    Minutes per session: Not on file  . Stress: Not on file  Relationships  . Social Herbalist on phone: Not on file    Gets together: Not on file    Attends religious service: Not on file    Active member of club or organization: Not  on file    Attends meetings of clubs or organizations: Not on file    Relationship status: Not on file  Other Topics Concern  . Not on file  Social History Narrative   Patient is married and has 2 children.    Right-handed   Caffeine: 1 1/2 cups of tea per day   [name] Known Allergies Family History  Problem Relation Age of Onset  . Diabetes Father   . Anemia Father   . Seizures Neg Hx       Current Outpatient Medications (Respiratory):  .  fluticasone (FLONASE) 50 MCG/ACT nasal spray, Place 2 sprays into both nostrils daily. .  montelukast (SINGULAIR) 10 MG tablet, Take 10 mg by mouth daily.    Current Outpatient Medications (Other):  .  cholecalciferol (VITAMIN D) 1000 UNITS tablet, Take 1,000 Units by mouth daily. Marland Kitchen  levETIRAcetam (KEPPRA XR) 500 MG 24 hr tablet, Take 2 every morning .  Multiple Vitamin (MULTIVITAMIN) tablet, Take 1 tablet by mouth daily. .  Vitamin D, Ergocalciferol, (DRISDOL) 1.25 MG (50000 UT) CAPS capsule, Take 1 capsule (50,000 Units total) by mouth every 7 (seven) days.    Past medical history, social, surgical and family history all reviewed in electronic medical record.  [name] pertanent  information unless stated regarding to the chief complaint.   Review of Systems:  [name] headache, visual changes, nausea, vomiting, diarrhea, constipation, dizziness, abdominal pain, skin rash, fevers, chills, night sweats, weight loss, swollen lymph nodes, body aches, joint swelling, muscle aches, chest pain, shortness of breath, mood changes.  Positive muscle aches  Objective  Blood pressure 106/76, pulse 81, height 5\' 3"  (1.6 m), weight 140 lb (63.5 kg), SpO2 97 %.   General: [name] apparent distress alert and oriented x3 mood and affect normal, dressed appropriately.  HEENT: Pupils equal, extraocular movements intact  Respiratory: Patient's speak in full sentences and does not appear short of breath  Cardiovascular: [name] lower extremity edema, non tender, [name] erythema   Skin: Warm dry intact with [name] signs of infection or rash on extremities or on axial skeleton.  Abdomen: Soft nontender  Neuro: Cranial nerves II through XII are intact, neurovascularly intact in all extremities with 2+ DTRs and 2+ pulses.  Lymph: [name] lymphadenopathy of posterior or anterior cervical chain or axillae bilaterally.  Gait normal with good balance and coordination.  MSK:  Non tender with full range of motion and good stability and symmetric strength and tone of shoulders, elbows, wrist, hip, knee and ankles bilaterally.  Right hand exam shows the patient is moderately tender over the PIP of the fifth finger.  Patient does wear a ring on her fifth finger this seems to hit on the fourth finger when patient is having the discomfort.  Patient has full range of motion, [name] angulation of the fingers noted.  Good grip strength.  Limited musculoskeletal ultrasound was performed and interpreted by Lyndal Pulley  Limited ultrasound of patient's fifth finger shows the patient has a very small trace effusion noted of the PIP.  Questionable small calcific changes in the area but [name] significant cortical irregularities are noted.  [name] dynamic impingement noted.  Extensor tendon appears to be intact. Impression, small effusion.   Impression and Recommendations:     This case required medical decision making of moderate complexity. The above documentation has been reviewed and is accurate and complete Lyndal Pulley, DO       Note: This dictation was prepared with Dragon dictation along with smaller phrase technology. Any transcriptional errors that result from this process are unintentional.

## 2019-07-06 ENCOUNTER — Ambulatory Visit: Payer: Self-pay

## 2019-07-06 ENCOUNTER — Encounter: Payer: Self-pay | Admitting: Family Medicine

## 2019-07-06 ENCOUNTER — Ambulatory Visit (INDEPENDENT_AMBULATORY_CARE_PROVIDER_SITE_OTHER): Payer: No Typology Code available for payment source | Admitting: Family Medicine

## 2019-07-06 VITALS — BP 106/76 | HR 81 | Ht 63.0 in | Wt 140.0 lb

## 2019-07-06 DIAGNOSIS — M79642 Pain in left hand: Secondary | ICD-10-CM

## 2019-07-06 DIAGNOSIS — M79641 Pain in right hand: Secondary | ICD-10-CM

## 2019-07-06 DIAGNOSIS — E559 Vitamin D deficiency, unspecified: Secondary | ICD-10-CM | POA: Insufficient documentation

## 2019-07-06 MED ORDER — VITAMIN D (ERGOCALCIFEROL) 1.25 MG (50000 UNIT) PO CAPS: 50000.0000 [IU] | ORAL_CAPSULE | ORAL | 0 refills | Status: DC

## 2019-07-06 MED FILL — VIT D2 1.25 MG (50,000 UNIT: 1.25 MG | 84 days supply | Qty: 12 | Fill #0

## 2019-07-06 NOTE — Patient Instructions (Signed)
Good to see you.  Ice 20 minutes 2 times daily. Usually after activity and before bed. Exercises 3 times a week.  Once weekly vitamin D for 12 weeks.  Turmeric 500mg  daily  Tart cherry extract 1200mg  at night Tape fingers w repetitive activity Calcium pyruvate 1500mg  daily My fitness pal for 3 days to check protein See me again in 6-8 weeks if not better

## 2019-07-06 NOTE — Assessment & Plan Note (Signed)
Vitamin D deficiency.  Discussed which activities to do which wants to avoid.  Patient is to increase activity slowly over the course the next several weeks.  Follow-up again in 4 to 8 weeks.  Patient will likely do it for the winter.

## 2019-07-06 NOTE — Assessment & Plan Note (Signed)
Right hand pain.  Discussed with patient in great length: Calcific changes could be secondary to patient's previous low vitamin D levels.  Patient is concern for an autoimmune disease with laboratory work-up has shown an ANA positive but with a low titer.  This is highly unlikely would not change medical management with medications.  We discussed over-the-counter medications including topical anti-inflammatories, icing regimen, bracing the Dettinger at this moment.  Patient will try the conservative therapy and if having worsening symptoms patient can follow-up with me again sooner otherwise we will see him again in 8 to 12 weeks

## 2019-07-14 MED FILL — MONTELUKAST SOD 10 MG TAB: 10 | 90 days supply | Qty: 90 | Fill #2

## 2019-07-28 IMAGING — DX DG CHEST 2V
2 series · 2 of 2 positions shown · non-contrast
Comparison: 12/05/2013

CLINICAL DATA: Chest pain and left arm pain for several days

EXAM:
CHEST - 2 VIEW

[chest pa]
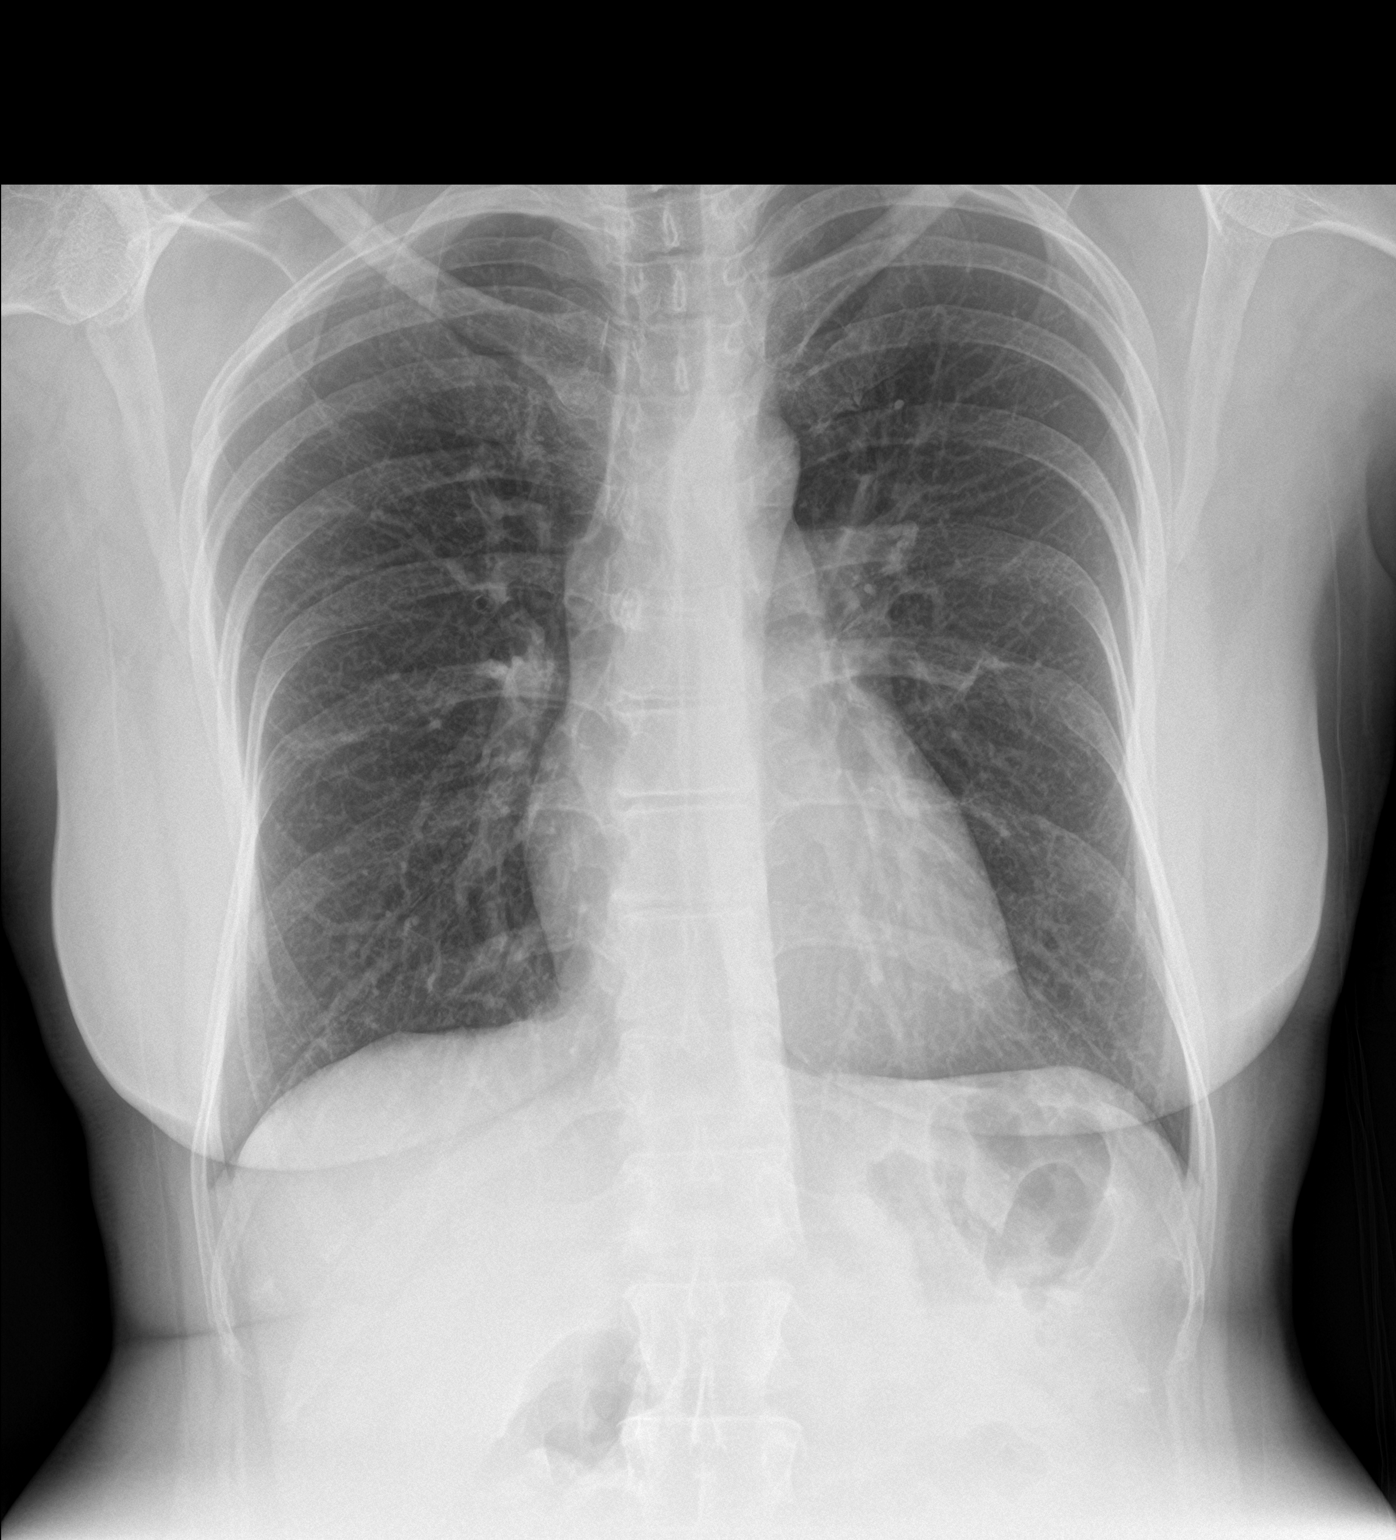

[chest lat]
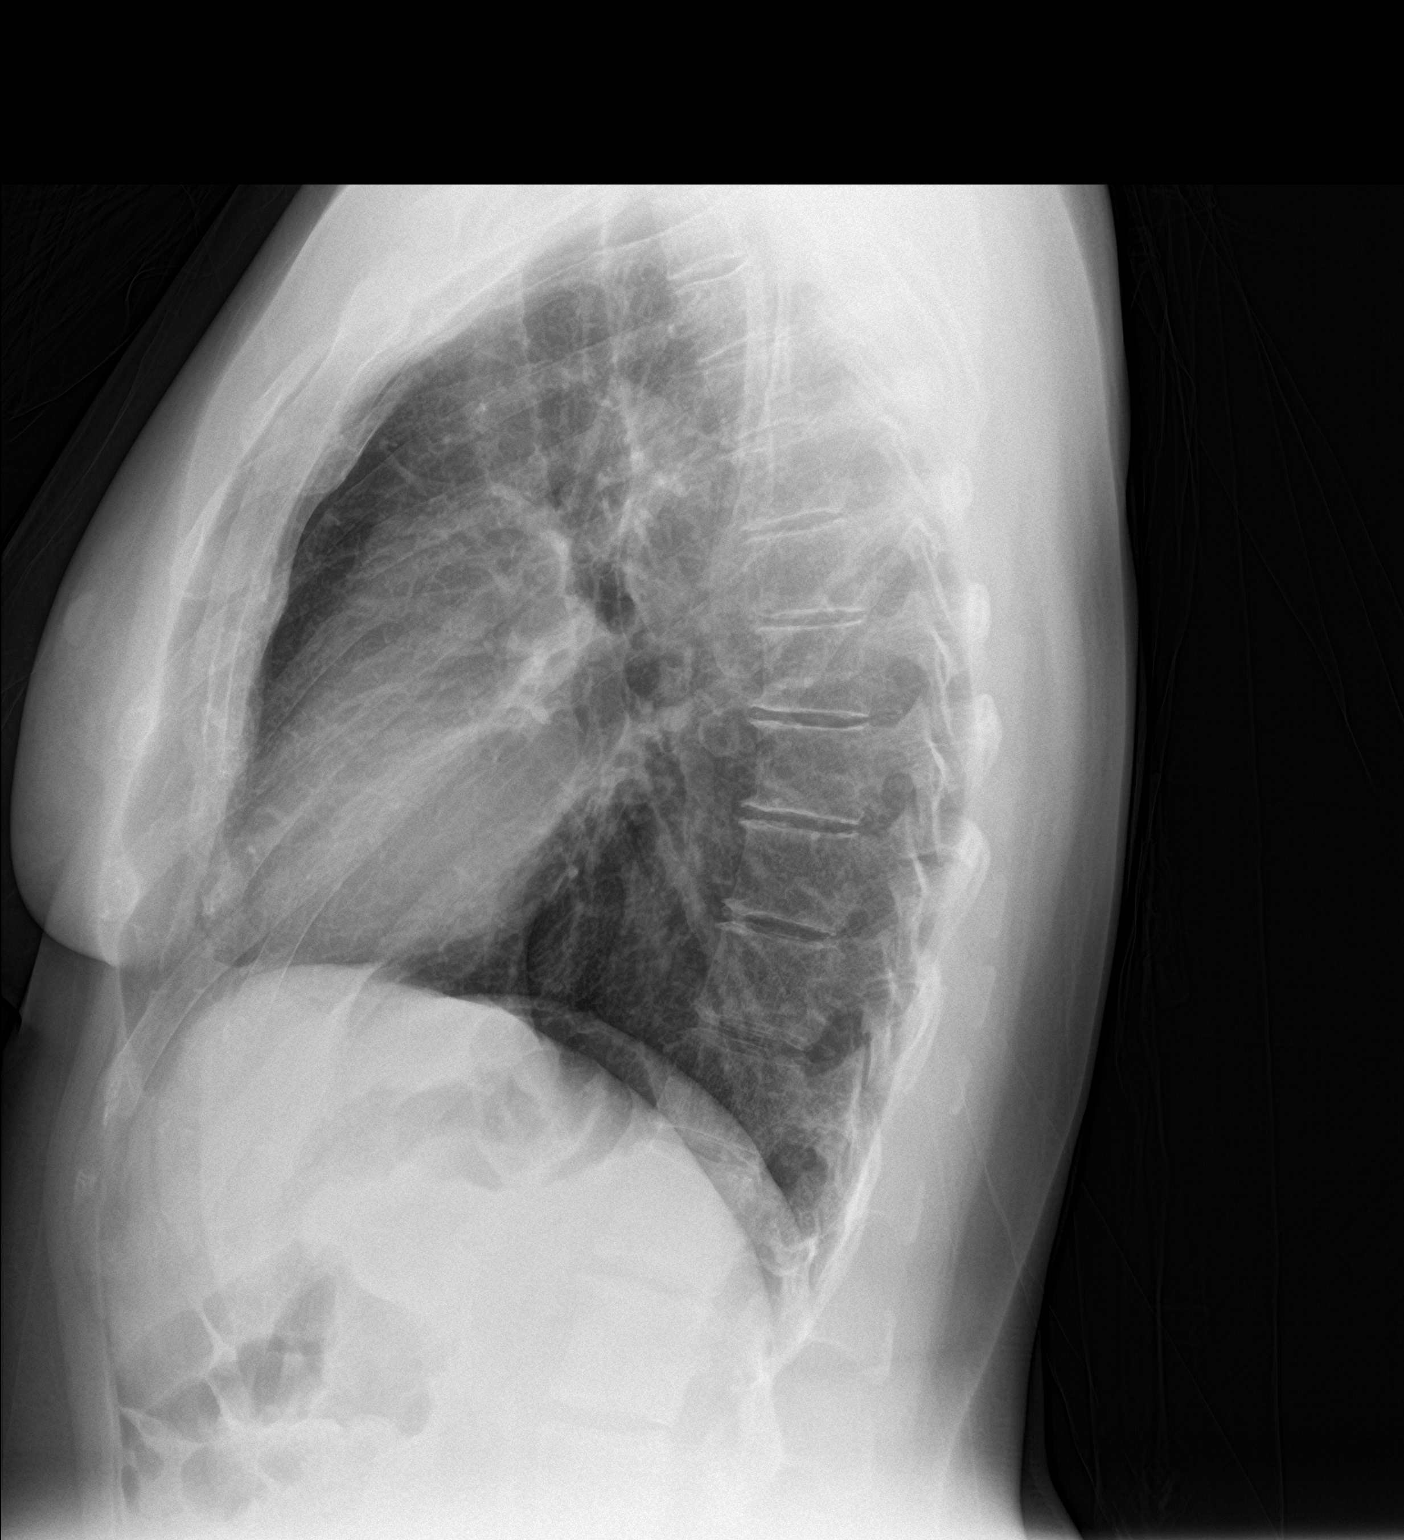

[2 of 2 positions shown; findings below may reference images not displayed]

FINDINGS: Cardiac shadow is stable. The lungs are well aerated bilaterally. No
focal infiltrate or sizable effusion is seen. Mild degenerative
changes of the thoracic spine are noted. No acute bony abnormality
is seen.
IMPRESSION: No active cardiopulmonary disease.

## 2019-08-10 MED FILL — LEVETIRACETAM ER 500 MG TAB: 500 | 90 days supply | Qty: 180 | Fill #0

## 2019-08-17 ENCOUNTER — Ambulatory Visit: Payer: No Typology Code available for payment source | Admitting: Family Medicine

## 2019-11-09 MED FILL — MONTELUKAST SOD 10 MG TAB: 10 | 90 days supply | Qty: 90 | Fill #0

## 2019-11-09 MED FILL — LEVETIRACETAM ER 500 MG TAB: 500 | 90 days supply | Qty: 180 | Fill #1

## 2019-11-26 ENCOUNTER — Other Ambulatory Visit: Payer: Self-pay

## 2019-11-26 ENCOUNTER — Other Ambulatory Visit (INDEPENDENT_AMBULATORY_CARE_PROVIDER_SITE_OTHER): Payer: No Typology Code available for payment source

## 2019-11-26 ENCOUNTER — Ambulatory Visit (INDEPENDENT_AMBULATORY_CARE_PROVIDER_SITE_OTHER): Payer: No Typology Code available for payment source | Admitting: Internal Medicine

## 2019-11-26 ENCOUNTER — Encounter: Payer: Self-pay | Admitting: Internal Medicine

## 2019-11-26 VITALS — BP 132/70 | HR 66 | Temp 98.4°F | Ht 63.0 in | Wt 138.4 lb

## 2019-11-26 DIAGNOSIS — M791 Myalgia, unspecified site: Secondary | ICD-10-CM

## 2019-11-26 DIAGNOSIS — R7303 Prediabetes: Secondary | ICD-10-CM | POA: Diagnosis not present

## 2019-11-26 DIAGNOSIS — E559 Vitamin D deficiency, unspecified: Secondary | ICD-10-CM | POA: Diagnosis not present

## 2019-11-26 LAB — COMPREHENSIVE METABOLIC PANEL
ALT: 11 U/L (ref 0–35)
AST: 16 U/L (ref 0–37)
Albumin: 4.1 g/dL (ref 3.5–5.2)
Alkaline Phosphatase: 64 U/L (ref 39–117)
BUN: 9 mg/dL (ref 6–23)
CO2: 27 mEq/L (ref 19–32)
Calcium: 8.9 mg/dL (ref 8.4–10.5)
Chloride: 104 mEq/L (ref 96–112)
Creatinine, Ser: 0.71 mg/dL (ref 0.40–1.20)
GFR: 87.34 mL/min (ref >60.00)
Glucose, Bld: 99 mg/dL (ref 70–99)
Potassium: 3.9 mEq/L (ref 3.5–5.1)
Sodium: 139 mEq/L (ref 135–145)
Total Bilirubin: 0.5 mg/dL (ref 0.2–1.2)
Total Protein: 7.2 g/dL (ref 6.0–8.3)

## 2019-11-26 LAB — CBC
HCT: 37.9 % (ref 36.0–46.0)
Hemoglobin: 12.7 g/dL (ref 12.0–15.0)
MCHC: 33.4 g/dL (ref 30.0–36.0)
MCV: 89.2 fl (ref 78.0–100.0)
Platelets: 226 10*3/uL (ref 150.0–400.0)
RBC: 4.25 Mil/uL (ref 3.87–5.11)
RDW: 13.2 % (ref 11.5–15.5)
WBC: 6.8 10*3/uL (ref 4.0–10.5)

## 2019-11-26 LAB — HEMOGLOBIN A1C: Hgb A1c MFr Bld: 5.8 % (ref 4.6–6.5)

## 2019-11-26 LAB — VITAMIN D 25 HYDROXY (VIT D DEFICIENCY, FRACTURES): VITD: 35.95 ng/mL (ref 30.00–100.00)

## 2019-11-26 NOTE — Assessment & Plan Note (Signed)
Needs repeat HgA1c, has made dietary and exercise changes. Family history of diabetes.

## 2019-11-26 NOTE — Assessment & Plan Note (Signed)
Checking ANA again to see if this has changed. Continue tumeric and tart cherry. She is using muscle rub on the shoulders and neck with good results and advised to continue that.

## 2019-11-26 NOTE — Patient Instructions (Signed)
We will check the blood work today and likely put you back on the vitamin D.

## 2019-11-26 NOTE — Progress Notes (Signed)
   Subjective:   Patient ID: Lori [name], female    DOB: [birth year], 49 y.o.   MRN: [id number]  HPI The patient is a 49 YO female coming in for pain in the shoulders, upper arms and neck for the last 3 weeks. She is not sure if related but had a small lesion on the right shoulder which spontaneously drained out pus/blood and then resolved a few days later. She denies fevers or chills around this time. This was about when she started noticing the pain. The pain is more achy in the muscles of the shoulder, upper arm and neck. Sometimes mild headache. Mostly in the morning and seems to last 3 hours or so. Then she does not notice it. Sometimes in the evening she gets pain again and sometimes not. She previously was evaluated for autoimmune disease and had mild positive ANA but negative RF. She was treated for vitamin D deficiency and added tumeric and tart cherry and symptoms did resolve (at that time more hand pain). She is now feeling like those symptoms are gradually returning (but in shoulders/arms rather than hands). Also needs follow up pre-diabetes (has made changes to diet and added regular yoga for exercise, lost some weight and gained some of that back but overall still down slightly).   Review of Systems  Constitutional: Negative.   HENT: Negative.   Eyes: Negative.   Respiratory: Negative for cough, chest tightness and shortness of breath.   Cardiovascular: Negative for chest pain, palpitations and leg swelling.  Gastrointestinal: Negative for abdominal distention, abdominal pain, constipation, diarrhea, nausea and vomiting.  Musculoskeletal: Positive for myalgias.  Skin: Negative.   Neurological: Negative.   Psychiatric/Behavioral: Negative.     Objective:  Physical Exam Constitutional:      Appearance: She is well-developed.  HENT:     Head: Normocephalic and atraumatic.  Cardiovascular:     Rate and Rhythm: Normal rate and regular rhythm.  Pulmonary:     Effort: Pulmonary  effort is normal. No respiratory distress.     Breath sounds: Normal breath sounds. No wheezing or rales.  Abdominal:     General: Bowel sounds are normal. There is no distension.     Palpations: Abdomen is soft.     Tenderness: There is no abdominal tenderness. There is no rebound.  Musculoskeletal:        General: No tenderness.     Cervical back: Normal range of motion.     Comments: No tenderness to palpation in the neck/shoulders/arms region bilaterally  Skin:    General: Skin is warm and dry.  Neurological:     Mental Status: She is alert and oriented to person, place, and time.     Coordination: Coordination normal.     Vitals:   11/26/19 0848  BP: 132/70  Pulse: 66  Temp: 98.4 F (36.9 C)  SpO2: 99%  Weight: 138 lb 6.4 oz (62.8 kg)  Height: 5\' 3"  (1.6 m)    This visit occurred during the SARS-CoV-2 public health emergency.  Safety protocols were in place, including screening questions prior to the visit, additional usage of staff PPE, and extensive cleaning of exam room while observing appropriate contact time as indicated for disinfecting solutions.   Assessment & Plan:

## 2019-11-26 NOTE — Assessment & Plan Note (Signed)
Recheck vitamin D level and if needed refill 50000 units vitamin D weekly as this seemed to help with her symptoms well.

## 2019-11-29 LAB — ANA, IFA COMPREHENSIVE PANEL
Anti Nuclear Antibody (ANA): NEGATIVE
ENA SM Ab Ser-aCnc: <1 AI
SM/RNP: <1 AI
SSA (Ro) (ENA) Antibody, IgG: <1 AI
SSB (La) (ENA) Antibody, IgG: <1 AI
Scleroderma (Scl-70) (ENA) Antibody, IgG: <1 AI
ds DNA Ab: 1 IU/mL

## 2019-11-30 ENCOUNTER — Other Ambulatory Visit: Payer: Self-pay | Admitting: Internal Medicine

## 2019-11-30 MED ORDER — VITAMIN D (ERGOCALCIFEROL) 1.25 MG (50000 UNIT) PO CAPS: 50000.0000 [IU] | ORAL_CAPSULE | ORAL | 1 refills | Status: DC

## 2019-11-30 MED FILL — VIT D2 1.25 MG (50,000 UNIT: 1.25 MG | 84 days supply | Qty: 12 | Fill #0

## 2020-02-23 MED FILL — LEVETIRACETAM ER 500 MG TAB: 500 | 90 days supply | Qty: 180 | Fill #2

## 2020-05-24 MED FILL — LEVETIRACETAM ER 500 MG TAB: 500 | 90 days supply | Qty: 180 | Fill #3

## 2020-06-15 NOTE — Progress Notes (Signed)
Subjective:    Patient ID: Lori [name], female    DOB: June 30, 1971, 49 y.o.   MRN: [id number]  HPI The patient is here for an acute visit.  Joint pain - it started with shoulder pain on left side and then started hurting on entire right and left arms.  Her pain is there constantly, but anytime she does anything physical her back and arms hurt.  If she does a squats or does stairs her knee hurt.  Sometimes when she wakes up her feet hurt.  sometimes she has left hip pain - she has a hisotry of bursitis and thought it may be that.   Her finger joints and wrists do not hurt.  Her legs feel heavy at times.  She denies muscle pain in her legs.  Has neck pain, which seems to be related to upper back muscular pain.Marland Kitchen  occ lightheadedness.   Her symptoms started mid-September.  It is getting worse.  She is tired more and is not able to do what she typically does.  Tylenol does not help much.  Two ibuprofen in the morning helped.    Medications and allergies reviewed with patient and updated if appropriate.  Patient Active Problem List   Diagnosis Date Noted  . Pre-diabetes 11/26/2019  . Right hand pain 07/06/2019  . Vitamin D deficiency 07/06/2019  . Headache syndrome 06/17/2017  . Allergic rhinitis 11/01/2016  . Subacute thyroiditis 06/13/2015  . Back pain 04/27/2015  . Myalgia 04/25/2015  . Routine general medical examination at a health care facility 07/28/2014  . Seizure disorder, primary generalized ([place]) 03/19/2013    Current Outpatient Medications on File Prior to Visit  Medication Sig Dispense Refill  . calcium-vitamin D (OSCAL WITH D) 500-200 MG-UNIT tablet Take 1 tablet by mouth.    . cholecalciferol (VITAMIN D) 1000 UNITS tablet Take 1,000 Units by mouth daily.    . fluticasone (FLONASE) 50 MCG/ACT nasal spray Place 2 sprays into both nostrils daily. 16 g 6  . levETIRAcetam (KEPPRA XR) 500 MG 24 hr tablet Take 1,000 mg by mouth every morning.    . montelukast  (SINGULAIR) 10 MG tablet Take 10 mg by mouth daily.  3  . Multiple Vitamin (MULTIVITAMIN) tablet Take 1 tablet by mouth daily.    . Misc Natural Products (TART CHERRY ADVANCED PO) Take by mouth. (Patient not taking: Reported on 06/16/2020)     No current facility-administered medications on file prior to visit.    Past Medical History:  Diagnosis Date  . Seizure [facility])     History reviewed. No pertinent surgical history.  Social History   Socioeconomic History  . Marital status: Married    Spouse name: Not on file  . Number of children: 2  . Years of education: Not on file  . Highest education level: Not on file  Occupational History    Employer: [employer]  Tobacco Use  . Smoking status: Never Smoker  . Smokeless tobacco: Never Used  Substance and Sexual Activity  . Alcohol use: No  . Drug use: No  . Sexual activity: Not on file  Other Topics Concern  . Not on file  Social History Narrative   Patient is married and has 2 children.    Right-handed   Caffeine: 1 1/2 cups of tea per day   Social Determinants of Health   Financial Resource Strain:   . Difficulty of Paying Living Expenses: Not on file  Food Insecurity:   . Worried  About Running Out of Food in the Last Year: Not on file  . Ran Out of Food in the Last Year: Not on file  Transportation Needs:   . Lack of Transportation (Medical): Not on file  . Lack of Transportation (Non-Medical): Not on file  Physical Activity:   . Days of Exercise per Week: Not on file  . Minutes of Exercise per Session: Not on file  Stress:   . Feeling of Stress : Not on file  Social Connections:   . Frequency of Communication with Friends and Family: Not on file  . Frequency of Social Gatherings with Friends and Family: Not on file  . Attends Religious Services: Not on file  . Active Member of Clubs or Organizations: Not on file  . Attends Archivist Meetings: Not on file  . Marital Status: Not on file    Family  History  Problem Relation Age of Onset  . Diabetes Father   . Anemia Father   . Seizures Neg Hx     Review of Systems  Constitutional: Positive for fatigue (gets tired easily). Negative for chills and fever.  HENT: Positive for postnasal drip.   Eyes: Negative for visual disturbance.  Respiratory: Negative for cough, shortness of breath and wheezing.   Cardiovascular: Negative for chest pain and palpitations.  Gastrointestinal: Negative for abdominal pain and nausea.       Inc gas  Musculoskeletal: Positive for arthralgias (b/l shoulder, elbow ), back pain (upper back - muscle tightness), myalgias and neck pain (upper back - muscle tightness). Negative for joint swelling.  Skin: Negative for rash.  Neurological: Positive for light-headedness (occ moving quickly or looking from down to up head position, transient) and headaches (occ - ? sinus/allergies). Negative for weakness and numbness.       Objective:   Vitals:   06/16/20 0816  BP: 104/72  Pulse: 80  Temp: 98 F (36.7 C)  SpO2: 98%   BP Readings from Last 3 Encounters:  06/16/20 104/72  11/26/19 132/70  07/06/19 106/76   Wt Readings from Last 3 Encounters:  06/16/20 136 lb 3.2 oz (61.8 kg)  11/26/19 138 lb 6.4 oz (62.8 kg)  07/06/19 140 lb (63.5 kg)   Body mass index is 24.13 kg/m.   Physical Exam Constitutional:      General: She is not in acute distress.    Appearance: Normal appearance. She is not ill-appearing.  HENT:     Head: Normocephalic and atraumatic.  Cardiovascular:     Rate and Rhythm: Normal rate and regular rhythm.     Heart sounds: No murmur heard.   Pulmonary:     Effort: Pulmonary effort is normal. No respiratory distress.     Breath sounds: No wheezing or rales.  Musculoskeletal:        General: Tenderness (Upper back, neck with palpation of muscles) present. No swelling (No joint swelling) or deformity.     Right lower leg: No edema.     Left lower leg: No edema.  Skin:    General:  Skin is warm and dry.     Findings: No erythema or rash.  Neurological:     General: No focal deficit present.     Mental Status: She is alert.     Sensory: No sensory deficit.     Motor: No weakness.     Gait: Gait normal.            Assessment & Plan:    See Problem  List for Assessment and Plan of chronic medical problems.    This visit occurred during the SARS-CoV-2 public health emergency.  Safety protocols were in place, including screening questions prior to the visit, additional usage of staff PPE, and extensive cleaning of exam room while observing appropriate contact time as indicated for disinfecting solutions.

## 2020-06-16 ENCOUNTER — Ambulatory Visit (INDEPENDENT_AMBULATORY_CARE_PROVIDER_SITE_OTHER): Payer: No Typology Code available for payment source | Admitting: Internal Medicine

## 2020-06-16 ENCOUNTER — Other Ambulatory Visit: Payer: Self-pay

## 2020-06-16 ENCOUNTER — Other Ambulatory Visit: Payer: Self-pay | Admitting: Internal Medicine

## 2020-06-16 ENCOUNTER — Encounter: Payer: Self-pay | Admitting: Internal Medicine

## 2020-06-16 VITALS — BP 104/72 | HR 80 | Temp 98.0°F | Ht 63.0 in | Wt 136.2 lb

## 2020-06-16 DIAGNOSIS — M549 Dorsalgia, unspecified: Secondary | ICD-10-CM | POA: Insufficient documentation

## 2020-06-16 DIAGNOSIS — E559 Vitamin D deficiency, unspecified: Secondary | ICD-10-CM

## 2020-06-16 DIAGNOSIS — R5383 Other fatigue: Secondary | ICD-10-CM | POA: Insufficient documentation

## 2020-06-16 DIAGNOSIS — R700 Elevated erythrocyte sedimentation rate: Secondary | ICD-10-CM

## 2020-06-16 DIAGNOSIS — M25512 Pain in left shoulder: Secondary | ICD-10-CM | POA: Diagnosis not present

## 2020-06-16 DIAGNOSIS — M791 Myalgia, unspecified site: Secondary | ICD-10-CM

## 2020-06-16 DIAGNOSIS — M25511 Pain in right shoulder: Secondary | ICD-10-CM

## 2020-06-16 LAB — CBC WITH DIFFERENTIAL/PLATELET
Basophils Absolute: 0 10*3/uL (ref 0.0–0.1)
Basophils Relative: 0.6 % (ref 0.0–3.0)
Eosinophils Absolute: 0.2 10*3/uL (ref 0.0–0.7)
Eosinophils Relative: 3 % (ref 0.0–5.0)
HCT: 40.7 % (ref 36.0–46.0)
Hemoglobin: 13.3 g/dL (ref 12.0–15.0)
Lymphocytes Relative: 31.3 % (ref 12.0–46.0)
Lymphs Abs: 2 10*3/uL (ref 0.7–4.0)
MCHC: 32.8 g/dL (ref 30.0–36.0)
MCV: 88.6 fl (ref 78.0–100.0)
Monocytes Absolute: 0.4 10*3/uL (ref 0.1–1.0)
Monocytes Relative: 6.7 % (ref 3.0–12.0)
Neutro Abs: 3.7 10*3/uL (ref 1.4–7.7)
Neutrophils Relative %: 58.4 % (ref 43.0–77.0)
Platelets: 233 10*3/uL (ref 150.0–400.0)
RBC: 4.6 Mil/uL (ref 3.87–5.11)
RDW: 13.1 % (ref 11.5–15.5)
WBC: 6.3 10*3/uL (ref 4.0–10.5)

## 2020-06-16 LAB — COMPREHENSIVE METABOLIC PANEL
ALT: 13 U/L (ref 0–35)
AST: 17 U/L (ref 0–37)
Albumin: 4.3 g/dL (ref 3.5–5.2)
Alkaline Phosphatase: 71 U/L (ref 39–117)
BUN: 12 mg/dL (ref 6–23)
CO2: 29 mEq/L (ref 19–32)
Calcium: 9.6 mg/dL (ref 8.4–10.5)
Chloride: 103 mEq/L (ref 96–112)
Creatinine, Ser: 0.72 mg/dL (ref 0.40–1.20)
GFR: 97.8 mL/min (ref >60.00)
Glucose, Bld: 101 mg/dL — ABNORMAL HIGH (ref 70–99)
Potassium: 3.8 mEq/L (ref 3.5–5.1)
Sodium: 140 mEq/L (ref 135–145)
Total Bilirubin: 0.6 mg/dL (ref 0.2–1.2)
Total Protein: 7.8 g/dL (ref 6.0–8.3)

## 2020-06-16 LAB — VITAMIN D 25 HYDROXY (VIT D DEFICIENCY, FRACTURES): VITD: 46.68 ng/mL (ref 30.00–100.00)

## 2020-06-16 LAB — TSH: TSH: 1.13 u[IU]/mL (ref 0.35–4.50)

## 2020-06-16 LAB — SEDIMENTATION RATE: Sed Rate: 53 mm/hr — ABNORMAL HIGH (ref 0–20)

## 2020-06-16 MED ORDER — MELOXICAM 15 MG PO TABS: 15.0000 mg | ORAL_TABLET | Freq: Every day | ORAL | 0 refills | Status: DC

## 2020-06-16 MED FILL — MELOXICAM 15 MG TABLET: 15 | 30 days supply | Qty: 30 | Fill #0

## 2020-06-16 NOTE — Assessment & Plan Note (Signed)
Acute Muscular upper back and neck pain along with bilateral shoulder and arm pain ?  Ergonomics, activity related or possibly PMR ESR Will hold off on other autoimmune blood work since it has been negative in the past and this does not sound like lupus or rheumatoid arthritis We will see Dr. Tamala Julian Trial of meloxicam 50 mg daily Advised her to change her posture at work and work on stretching

## 2020-06-16 NOTE — Assessment & Plan Note (Addendum)
Acute Has myalgia in the upper back, neck and bilateral arms, but not necessarily in the legs Associated with some fatigue, lightheadedness, bilateral shoulder and elbow pain Autoimmune work-up in the past has been negative We will check ESR to evaluate for PMR ?  Ergonomics at work, bilateral shoulder issues with activities Start meloxicam 15 mg daily for 2-4 weeks to see if that helps Regular stretching We will set up an appointment with sports medicine

## 2020-06-16 NOTE — Assessment & Plan Note (Signed)
Acute Right arm is worse than left arm ?  Related to activity, ergonomics PMR is a possibility given her fatigue Symptoms are worsening ESR We will schedule appoint with Dr. Tamala Julian

## 2020-06-16 NOTE — Patient Instructions (Addendum)
  Blood work was ordered.     Medications reviewed and updated.  Changes include :   Start meloxicam daily with food - this is an anti-inflammatory.  Do not take any ibuprofen while taking this.  You can take tylenol if needed.    Your prescription(s) have been submitted to your pharmacy. Please take as directed and contact our office if you believe you are having problem(s) with the medication(s).   Make an appointment with Dr Tamala Julian downstairs.     Try gentle stretching.  Change your ergonomics at work.

## 2020-06-16 NOTE — Assessment & Plan Note (Signed)
Acute Started in September and has gotten worse ?  Related to muscle pain, joint pain We will check TSH, CBC, CMP and vitamin D level ESR to rule out possible PMR Further treatment based on above

## 2020-06-16 NOTE — Assessment & Plan Note (Signed)
Chronic Unlikely the cause of her symptoms She is taking vitamin D daily We will check level

## 2020-06-26 ENCOUNTER — Encounter: Payer: Self-pay | Admitting: Neurology

## 2020-06-26 ENCOUNTER — Other Ambulatory Visit: Payer: Self-pay | Admitting: Neurology

## 2020-06-26 ENCOUNTER — Ambulatory Visit: Payer: No Typology Code available for payment source | Admitting: Neurology

## 2020-06-26 VITALS — BP 99/66 | HR 77 | Ht 63.0 in | Wt 136.2 lb

## 2020-06-26 DIAGNOSIS — G40309 Generalized idiopathic epilepsy and epileptic syndromes, not intractable, without status epilepticus: Secondary | ICD-10-CM

## 2020-06-26 MED ORDER — LEVETIRACETAM ER 500 MG PO TB24: 1000.0000 mg | ORAL_TABLET | Freq: Every morning | ORAL | 4 refills | Status: DC

## 2020-06-26 NOTE — Patient Instructions (Signed)
Happy Birthday! Continue taking Keppra at current dosing Call for seizure activity See you back in 1 year

## 2020-06-26 NOTE — Progress Notes (Signed)
I have read the note, and I agree with the clinical assessment and plan.  Kylea Berrong K Amari Zagal   

## 2020-06-26 NOTE — Progress Notes (Signed)
PATIENT: Lori [name] DOB: [birth year]  REASON FOR VISIT: follow up HISTORY FROM: patient  HISTORY OF PRESENT ILLNESS: Today 06/26/20 Lori [name] is a 49 year old female with history of seizures, well controlled on Keppra.  Last seizure was in 2012, have been tonic-clonic, losing control of her bladder and biting her tongue.  She is taking Keppra XR 1000 mg in the morning.  Reports if she forgets a dose, the next morning she may have brain fog, can't sleep that night.  Has seen PCP for fatigue, shoulder pain, improved with meloxicam.  ESR was elevated.  Pending appointment with rheumatology.  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  Presents today for evaluation unaccompanied.  HISTORY 06/24/2019 SS: Lori [name] is a 49 year old female with history of seizures that have been well controlled on Keppra.  She is taking extended release Keppra 500 mg, in September 2019, she decreased her dose to 500 mg daily, but her higher maintenance dose was increased after last visit to 1000 mg daily.  Her last seizure occurred in 2012.  She has not had recurrent seizure.  She is tolerating Keppra without side effect.  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  She drives a car without difficulty.  She denies any trouble with her mood, balance, or gait.  She denies any new problems or concerns.  She does have chronic allergies.  She presents today for follow-up unaccompanied.   REVIEW OF SYSTEMS: Out of a complete 14 system review of symptoms, the patient complains only of the following symptoms, and all other reviewed systems are negative.  Seizure  ALLERGIES: No Known Allergies  HOME MEDICATIONS: Outpatient Medications Prior to Visit  Medication Sig Dispense Refill   calcium-vitamin D (OSCAL WITH D) 500-200 MG-UNIT tablet Take 1 tablet by mouth.     cholecalciferol (VITAMIN D) 1000 UNITS tablet Take 1,000 Units by mouth daily.     fluticasone  (FLONASE) 50 MCG/ACT nasal spray Place 2 sprays into both nostrils daily. 16 g 6   loratadine (CLARITIN) 10 MG tablet Take 10 mg by mouth daily as needed for allergies.     meloxicam (MOBIC) 15 MG tablet Take 1 tablet (15 mg total) by mouth daily. Take with food 30 tablet 0   Multiple Vitamin (MULTIVITAMIN) tablet Take 1 tablet by mouth daily.     TART CHERRY PO Take by mouth.     Turmeric 500 MG TABS Take by mouth.     levETIRAcetam (KEPPRA XR) 500 MG 24 hr tablet Take 1,000 mg by mouth every morning.     montelukast (SINGULAIR) 10 MG tablet Take 10 mg by mouth daily.  3   No facility-administered medications prior to visit.    PAST MEDICAL HISTORY: Past Medical History:  Diagnosis Date   Seizure ([place])     PAST SURGICAL HISTORY: No past surgical history on file.  FAMILY HISTORY: Family History  Problem Relation Age of Onset   Diabetes Father    Anemia Father    Seizures Neg Hx     SOCIAL HISTORY: Social History   Socioeconomic History   Marital status: Married    Spouse name: Not on file   Number of children: 2   Years of education: Not on file   Highest education level: Not on file  Occupational History    Employer: [employer]  Tobacco Use   Smoking status: Never Smoker   Smokeless tobacco: Never Used  Substance and  Sexual Activity   Alcohol use: No   Drug use: No   Sexual activity: Not on file  Other Topics Concern   Not on file  Social History Narrative   Patient is married and has 2 children.    Right-handed   Caffeine: 1 1/2 cups of tea per day   Social Determinants of Health   Financial Resource Strain:    Difficulty of Paying Living Expenses: Not on file  Food Insecurity:    Worried About Charity fundraiser in the Last Year: Not on file   [employer] of Food in the Last Year: Not on file  Transportation Needs:    Lack of Transportation (Medical): Not on file   Lack of Transportation (Non-Medical): Not on file  Physical  Activity:    Days of Exercise per Week: Not on file   Minutes of Exercise per Session: Not on file  Stress:    Feeling of Stress : Not on file  Social Connections:    Frequency of Communication with Friends and Family: Not on file   Frequency of Social Gatherings with Friends and Family: Not on file   Attends Religious Services: Not on file   Active Member of Clubs or Organizations: Not on file   Attends Archivist Meetings: Not on file   Marital Status: Not on file  Intimate Partner Violence:    Fear of Current or Ex-Partner: Not on file   Emotionally Abused: Not on file   Physically Abused: Not on file   Sexually Abused: Not on file   PHYSICAL EXAM  Vitals:   06/26/20 0742  BP: 99/66  Pulse: 77  Weight: 136 lb 3.2 oz (61.8 kg)  Height: $Remove'5\' 3"'KVHFGIO$  (1.6 m)   Body mass index is 24.13 kg/m.  Generalized: Well developed, in no acute distress   Neurological examination  Mentation: Alert oriented to time, place, history taking. Follows all commands speech and language fluent Cranial nerve II-XII: Pupils were equal round reactive to light. Extraocular movements were full, visual field were full on confrontational test. Facial sensation and strength were normal. Head turning and shoulder shrug  were normal and symmetric. Motor: The motor testing reveals 5 over 5 strength of all 4 extremities. Good symmetric motor tone is noted throughout.  Sensory: Sensory testing is intact to soft touch on all 4 extremities. No evidence of extinction is noted.  Coordination: Cerebellar testing reveals good finger-nose-finger and heel-to-shin bilaterally.  Gait and station: Gait is normal.  Reflexes: Deep tendon reflexes are symmetric and normal bilaterally.   DIAGNOSTIC DATA (LABS, IMAGING, TESTING) - I reviewed patient records, labs, notes, testing and imaging myself where available.  Lab Results  Component Value Date   WBC 6.3 06/16/2020   HGB 13.3 06/16/2020   HCT 40.7  06/16/2020   MCV 88.6 06/16/2020   PLT 233.0 06/16/2020      Component Value Date/Time   NA 140 06/16/2020 0902   K 3.8 06/16/2020 0902   CL 103 06/16/2020 0902   CO2 29 06/16/2020 0902   GLUCOSE 101 (H) 06/16/2020 0902   BUN 12 06/16/2020 0902   CREATININE 0.72 06/16/2020 0902   CALCIUM 9.6 06/16/2020 0902   PROT 7.8 06/16/2020 0902   ALBUMIN 4.3 06/16/2020 0902   AST 17 06/16/2020 0902   ALT 13 06/16/2020 0902   ALKPHOS 71 06/16/2020 0902   BILITOT 0.6 06/16/2020 0902   GFRNONAA >60 09/21/2010 0630   GFRAA  09/21/2010 0630    >60  The eGFR has been calculated using the MDRD equation. This calculation has not been validated in all clinical situations. eGFR's persistently <60 mL/min signify possible Chronic Kidney Disease.   Lab Results  Component Value Date   CHOL 185 06/02/2019   HDL 52.70 06/02/2019   LDLCALC 118 (H) 06/02/2019   TRIG 72.0 06/02/2019   CHOLHDL 4 06/02/2019   Lab Results  Component Value Date   HGBA1C 5.8 11/26/2019   Lab Results  Component Value Date   XYOFVWAQ77 373 06/02/2019   Lab Results  Component Value Date   TSH 1.13 06/16/2020    ASSESSMENT AND PLAN 49 y.o. year old female  has a past medical history of Seizure ([place]). here with:  1.  Seizure disorder -Last seizure 2012, well controlled on Keppra -Continue Keppra XR 500 mg, 2 tablets daily -Call for seizure activity, follow-up 1 year or sooner if needed  I spent 20 minutes of face-to-face and non-face-to-face time with patient.  This included previsit chart review, lab review, study review, order entry, electronic health record documentation, patient education.  Butler Denmark, AGNP-C, DNP 06/26/2020, 8:04 AM [facility] [address], [place], [place] [address] [phone]

## 2020-07-24 ENCOUNTER — Ambulatory Visit: Payer: No Typology Code available for payment source | Admitting: Family Medicine

## 2020-07-26 ENCOUNTER — Ambulatory Visit: Payer: No Typology Code available for payment source | Admitting: Internal Medicine

## 2020-07-26 ENCOUNTER — Other Ambulatory Visit: Payer: Self-pay

## 2020-07-26 ENCOUNTER — Encounter: Payer: Self-pay | Admitting: Internal Medicine

## 2020-07-26 VITALS — BP 92/62 | HR 66 | Ht 63.0 in | Wt 135.0 lb

## 2020-07-26 DIAGNOSIS — M25511 Pain in right shoulder: Secondary | ICD-10-CM

## 2020-07-26 DIAGNOSIS — G8929 Other chronic pain: Secondary | ICD-10-CM | POA: Diagnosis not present

## 2020-07-26 DIAGNOSIS — R5383 Other fatigue: Secondary | ICD-10-CM | POA: Diagnosis not present

## 2020-07-26 DIAGNOSIS — R700 Elevated erythrocyte sedimentation rate: Secondary | ICD-10-CM | POA: Diagnosis not present

## 2020-07-26 DIAGNOSIS — M25512 Pain in left shoulder: Secondary | ICD-10-CM

## 2020-07-26 LAB — SEDIMENTATION RATE: Sed Rate: 28 mm/h — ABNORMAL HIGH (ref 0–20)

## 2020-07-26 NOTE — Patient Instructions (Signed)
I do not see any particular findings in your previous workup or today in need of new treatment. I will repeat the elevated sed rate in case it is increasing out of concordance with your joint pain. Because your symptoms are currently improving and no obvious inflammatory changes are apparent on exam, trying to evaluated at a later time when symptoms are more prevalent would be helpful, so it would be best to follow up whenever they are worsening.

## 2020-07-26 NOTE — Progress Notes (Signed)
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 female here for evaluation of elevated sedimentation rate checked in association with fatigue and bilateral shoulder pain. These symptoms started around October with R>L shoulder pain that was continuous but worsened with use and when lying on a side at night. Before this she generally did not experience daily pain. She had a previous episode of joint pain last fall with almost a year between issues. She started taking meloxicam as recommended and noticed about a 90% improvement in shoulder pain with 2 weeks of this medication. She does still have fatigue.  Activities of Daily Living:  Patient reports morning stiffness for 0 minutes.   Patient Denies nocturnal pain.  Difficulty dressing/grooming: Denies Difficulty climbing stairs: Denies Difficulty getting out of chair: Denies Difficulty using hands for taps, buttons, cutlery, and/or writing: Denies  Review of Systems  Constitutional: Positive for fatigue.  HENT: Negative for mouth sores, mouth dryness and nose dryness.   Eyes: Negative for pain, itching, visual disturbance and dryness.  Respiratory: Negative for cough, hemoptysis, shortness of breath and difficulty breathing.   Cardiovascular: Negative for chest pain, palpitations and swelling in legs/feet.  Gastrointestinal: Negative for abdominal pain, blood in stool, constipation and diarrhea.  Endocrine: Negative for increased urination.  Genitourinary: Negative for painful urination.  Musculoskeletal: Negative for arthralgias, joint pain, joint swelling, myalgias, muscle weakness, morning stiffness, muscle tenderness and  myalgias.  Skin: Negative for color change, rash and redness.  Allergic/Immunologic: Negative for susceptible to infections.  Neurological: Negative for dizziness, numbness, headaches, memory loss and weakness.  Hematological: Negative for swollen glands.  Psychiatric/Behavioral: Negative for confusion and sleep disturbance.    PMFS History:  Patient Active Problem List   Diagnosis Date Noted  . Elevated sedimentation rate 07/26/2020  . Fatigue 06/16/2020  . Bilateral shoulder pain 06/16/2020  . Upper back pain 06/16/2020  . Pre-diabetes 11/26/2019  . Right hand pain 07/06/2019  . Vitamin D deficiency 07/06/2019  . Headache syndrome 06/17/2017  . Allergic rhinitis 11/01/2016  . Subacute thyroiditis 06/13/2015  . Back pain 04/27/2015  . Myalgia 04/25/2015  . Routine general medical examination at a health care facility 07/28/2014  . Seizure disorder, primary generalized ([place]) 03/19/2013    Past Medical History:  Diagnosis Date  . Seizure [facility])     Family History  Problem Relation Age of Onset  . Diabetes Father   . Anemia Father   . Seizures Neg Hx    History reviewed. No pertinent surgical history. Social History   Social History Narrative   Patient is married and has 2 children.    Right-handed   Caffeine: 1 1/2 cups of tea per day   Immunization History  Administered Date(s) Administered  . Influenza Whole 05/12/2014  . Influenza,inj,Quad PF,6+ Mos 05/13/2015, 05/27/2016, 04/14/2019  . Influenza-Unspecified 05/13/2017, 05/13/2018  . PFIZER SARS-COV-2 Vaccination 08/14/2019, 09/02/2019  . Tdap 04/25/2015     Objective: Vital Signs: BP 92/62 (BP Location: Left Arm, Patient Position: Sitting, Cuff Size: Small)   Pulse 66   Ht  _0  (1.6 m)   Wt 135 lb (61.2 kg)   BMI 23.91 kg/m    Physical Exam HENT:     Right Ear: External ear normal.     Left Ear: External ear normal.     Mouth/Throat:     Mouth: Mucous membranes are moist.     Pharynx: Oropharynx  is clear.  Eyes:     Conjunctiva/sclera: Conjunctivae normal.  Cardiovascular:     Rate and Rhythm: Normal rate and regular rhythm.  Pulmonary:     Effort: Pulmonary effort is normal.     Breath sounds: Normal breath sounds.  Skin:    General: Skin is warm and dry.     Findings: No rash.  Neurological:     General: No focal deficit present.     Mental Status: She is alert.  Psychiatric:        Mood and Affect: Mood normal.     Musculoskeletal Exam:  Neck full range of motion no tenderness Shoulder, elbow, wrist, fingers full range of motion no tenderness or swelling Normal hip internal and external rotation without pain Knees, ankles, MTPs full range of motion no tenderness or swelling   Investigation: No additional findings.  Imaging: No results found.  Recent Labs: Lab Results  Component Value Date   WBC 6.3 06/16/2020   HGB 13.3 06/16/2020   PLT 233.0 06/16/2020   NA 140 06/16/2020   K 3.8 06/16/2020   CL 103 06/16/2020   CO2 29 06/16/2020   GLUCOSE 101 (H) 06/16/2020   BUN 12 06/16/2020   CREATININE 0.72 06/16/2020   BILITOT 0.6 06/16/2020   ALKPHOS 71 06/16/2020   AST 17 06/16/2020   ALT 13 06/16/2020   PROT 7.8 06/16/2020   ALBUMIN 4.3 06/16/2020   CALCIUM 9.6 06/16/2020   GFRAA  09/21/2010    >60        The eGFR has been calculated using the MDRD equation. This calculation has not been validated in all clinical situations. eGFR's persistently <60 mL/min signify possible Chronic Kidney Disease.    Speciality Comments: No specialty comments available.  Procedures:  No procedures performed Allergies: Patient has no known allergies.   Assessment / Plan:     Visit Diagnoses: Elevated sedimentation rate Fatigue, unspecified type - Plan: Sedimentation rate  No focal symptoms from history or exam to indicate a specific cause of ESR elevation and fatigue. Considering her large improvement in joint pain we will repeat ESR today if this is also  decreased could be transient or reactive process. No specific rheumatologic workup recommended at this time but it would be helpful if she can return whenever symptoms are in exacerbation again.  Chronic pain of both shoulders - Plan: Sedimentation rate  Shoulder pain described sounds like rotator cuff pathology although no history of particular injury or change in use. Full ROM on exam do not suspect signifcant OA although has had past right shoulder dislocation with fall injury years ago.  Orders: Orders Placed This Encounter  Procedures  . Sedimentation rate   No orders of the defined types were placed in this encounter.   Follow-Up Instructions: Return if symptoms worsen.   Collier Salina, MD  Note - This record has been created using [employer].  Chart creation errors have been sought, but may not always  have been located. Such creation errors do not reflect on  the standard of medical care.

## 2020-07-27 ENCOUNTER — Encounter: Payer: Self-pay | Admitting: Radiology

## 2020-07-27 NOTE — Progress Notes (Signed)
Sedimentation rate is decreased to 28 which is not a significant elevation. So her lab markers of inflammation are down as well as the pain being decreased so no additional workup or testing recommended at this time. But she can follow up as needed if her symptoms worsen again.

## 2020-07-28 ENCOUNTER — Encounter: Payer: Self-pay | Admitting: Radiology

## 2020-08-02 ENCOUNTER — Other Ambulatory Visit: Payer: Self-pay

## 2020-08-02 ENCOUNTER — Ambulatory Visit (INDEPENDENT_AMBULATORY_CARE_PROVIDER_SITE_OTHER): Payer: No Typology Code available for payment source | Admitting: Internal Medicine

## 2020-08-02 ENCOUNTER — Encounter: Payer: Self-pay | Admitting: Internal Medicine

## 2020-08-02 VITALS — BP 112/70 | HR 68 | Temp 98.4°F | Ht 63.0 in | Wt 137.0 lb

## 2020-08-02 DIAGNOSIS — M25511 Pain in right shoulder: Secondary | ICD-10-CM | POA: Diagnosis not present

## 2020-08-02 DIAGNOSIS — R7303 Prediabetes: Secondary | ICD-10-CM | POA: Diagnosis not present

## 2020-08-02 DIAGNOSIS — G8929 Other chronic pain: Secondary | ICD-10-CM

## 2020-08-02 DIAGNOSIS — R700 Elevated erythrocyte sedimentation rate: Secondary | ICD-10-CM

## 2020-08-02 DIAGNOSIS — M25512 Pain in left shoulder: Secondary | ICD-10-CM

## 2020-08-02 DIAGNOSIS — G40309 Generalized idiopathic epilepsy and epileptic syndromes, not intractable, without status epilepticus: Secondary | ICD-10-CM

## 2020-08-02 DIAGNOSIS — Z0000 Encounter for general adult medical examination without abnormal findings: Secondary | ICD-10-CM | POA: Diagnosis not present

## 2020-08-02 LAB — LIPID PANEL
Cholesterol: 172 mg/dL (ref 0–200)
HDL: 52.3 mg/dL (ref >39.00)
LDL Cholesterol: 105 mg/dL — ABNORMAL HIGH (ref 0–99)
NonHDL: 120.17
Total CHOL/HDL Ratio: 3
Triglycerides: 74 mg/dL (ref 0.0–149.0)
VLDL: 14.8 mg/dL (ref 0.0–40.0)

## 2020-08-02 LAB — HEMOGLOBIN A1C: Hgb A1c MFr Bld: 5.8 % (ref 4.6–6.5)

## 2020-08-02 NOTE — Assessment & Plan Note (Signed)
Seeing neurology and on keppra daily still. No new seizures.

## 2020-08-02 NOTE — Assessment & Plan Note (Signed)
Unclear clinical significance. Most recent was normal and mixed in past. Given age upper limit normal around 73.

## 2020-08-02 NOTE — Assessment & Plan Note (Signed)
Flu shot up to date. Covid-19 2 shots encouraged booster. Shingrix counseled she will consider. Tetanus up to date. Colonoscopy counseled she will consider. Mammogram up to date with gyn, pap smear up to date with gyn. Counseled about sun safety and mole surveillance. Counseled about the dangers of distracted driving. Given 10 year screening recommendations.

## 2020-08-02 NOTE — Assessment & Plan Note (Signed)
Checking HgA1c, there is family history.

## 2020-08-02 NOTE — Patient Instructions (Addendum)
I would consider to get the mirena out to see if this helps with the muscle aches. Think about getting colon cancer screening with colonoscopy or cologuard. Think about getting the shingles vaccine also.   Health Maintenance, Female Adopting a healthy lifestyle and getting preventive care are important in promoting health and wellness. Ask your health care provider about:  The right schedule for you to have regular tests and exams.  Things you can do on your own to prevent diseases and keep yourself healthy. What should I know about diet, weight, and exercise? Eat a healthy diet   Eat a diet that includes plenty of vegetables, fruits, low-fat dairy products, and lean protein.  Do not eat a lot of foods that are high in solid fats, added sugars, or sodium. Maintain a healthy weight Body mass index (BMI) is used to identify weight problems. It estimates body fat based on height and weight. Your health care provider can help determine your BMI and help you achieve or maintain a healthy weight. Get regular exercise Get regular exercise. This is one of the most important things you can do for your health. Most adults should:  Exercise for at least 150 minutes each week. The exercise should increase your heart rate and make you sweat (moderate-intensity exercise).  Do strengthening exercises at least twice a week. This is in addition to the moderate-intensity exercise.  Spend less time sitting. Even light physical activity can be beneficial. Watch cholesterol and blood lipids Have your blood tested for lipids and cholesterol at 50 years of age, then have this test every 5 years. Have your cholesterol levels checked more often if:  Your lipid or cholesterol levels are high.  You are older than 50 years of age.  You are at high risk for heart disease. What should I know about cancer screening? Depending on your health history and family history, you may need to have cancer screening at  various ages. This may include screening for:  Breast cancer.  Cervical cancer.  Colorectal cancer.  Skin cancer.  Lung cancer. What should I know about heart disease, diabetes, and high blood pressure? Blood pressure and heart disease  High blood pressure causes heart disease and increases the risk of stroke. This is more likely to develop in people who have high blood pressure readings, are of African descent, or are overweight.  Have your blood pressure checked: ? Every 3-5 years if you are 56-33 years of age. ? Every year if you are 68 years old or older. Diabetes Have regular diabetes screenings. This checks your fasting blood sugar level. Have the screening done:  Once every three years after age 50 if you are at a normal weight and have a low risk for diabetes.  More often and at a younger age if you are overweight or have a high risk for diabetes. What should I know about preventing infection? Hepatitis B If you have a higher risk for hepatitis B, you should be screened for this virus. Talk with your health care provider to find out if you are at risk for hepatitis B infection. Hepatitis C Testing is recommended for:  Everyone born from [birth year] through 1966.  Anyone with known risk factors for hepatitis C. Sexually transmitted infections (STIs)  Get screened for STIs, including gonorrhea and chlamydia, if: ? You are sexually active and are younger than 50 years of age. ? You are older than 50 years of age and your health care provider tells you that  you are at risk for this type of infection. ? Your sexual activity has changed since you were last screened, and you are at increased risk for chlamydia or gonorrhea. Ask your health care provider if you are at risk.  Ask your health care provider about whether you are at high risk for HIV. Your health care provider may recommend a prescription medicine to help prevent HIV infection. If you choose to take medicine to prevent  HIV, you should first get tested for HIV. You should then be tested every 3 months for as long as you are taking the medicine. Pregnancy  If you are about to stop having your period (premenopausal) and you may become pregnant, seek counseling before you get pregnant.  Take 400 to 800 micrograms (mcg) of folic acid every day if you become pregnant.  Ask for birth control (contraception) if you want to prevent pregnancy. Osteoporosis and menopause Osteoporosis is a disease in which the bones lose minerals and strength with aging. This can result in bone fractures. If you are 33 years old or older, or if you are at risk for osteoporosis and fractures, ask your health care provider if you should:  Be screened for bone loss.  Take a calcium or vitamin D supplement to lower your risk of fractures.  Be given hormone replacement therapy (HRT) to treat symptoms of menopause. Follow these instructions at home: Lifestyle  Do not use any products that contain nicotine or tobacco, such as cigarettes, e-cigarettes, and chewing tobacco. If you need help quitting, ask your health care provider.  Do not use street drugs.  Do not share needles.  Ask your health care provider for help if you need support or information about quitting drugs. Alcohol use  Do not drink alcohol if: ? Your health care provider tells you not to drink. ? You are pregnant, may be pregnant, or are planning to become pregnant.  If you drink alcohol: ? Limit how much you use to 0-1 drink a day. ? Limit intake if you are breastfeeding.  Be aware of how much alcohol is in your drink. In the [place] one drink equals one 12 oz bottle of beer (355 mL), one 5 oz glass of wine (148 mL), or one 1 oz glass of hard liquor (44 mL). General instructions  Schedule regular health, dental, and eye exams.  Stay current with your vaccines.  Tell your health care provider if: ? You often feel depressed. ? You have ever been abused or do  not feel safe at home. Summary  Adopting a healthy lifestyle and getting preventive care are important in promoting health and wellness.  Follow your health care provider's instructions about healthy diet, exercising, and getting tested or screened for diseases.  Follow your health care provider's instructions on monitoring your cholesterol and blood pressure. This information is not intended to replace advice given to you by your health care provider. Make sure you discuss any questions you have with your health care provider. Document Revised: 07/22/2018 Document Reviewed: 07/22/2018 Elsevier Patient Education  2020 [employer].

## 2020-08-02 NOTE — Progress Notes (Signed)
   Subjective:   Patient ID: Lori [name], female    DOB: 1970-12-23, 49 y.o.   MRN: [id number]  HPI The patient is a 49 YO female coming in for physical. Does still have ongoing concerns about muscle pains in shoulders and neck.   PMH, [facility], social history reviewed and updated  Review of Systems  Constitutional: Positive for fatigue.  HENT: Negative.   Eyes: Negative.   Respiratory: Negative for cough, chest tightness and shortness of breath.   Cardiovascular: Negative for chest pain, palpitations and leg swelling.  Gastrointestinal: Negative for abdominal distention, abdominal pain, constipation, diarrhea, nausea and vomiting.  Musculoskeletal: Positive for myalgias.  Skin: Negative.   Neurological: Negative.   Psychiatric/Behavioral: Negative.     Objective:  Physical Exam Constitutional:      Appearance: She is well-developed and well-nourished.  HENT:     Head: Normocephalic and atraumatic.  Eyes:     Extraocular Movements: EOM normal.  Cardiovascular:     Rate and Rhythm: Normal rate and regular rhythm.  Pulmonary:     Effort: Pulmonary effort is normal. No respiratory distress.     Breath sounds: Normal breath sounds. No wheezing or rales.  Abdominal:     General: Bowel sounds are normal. There is no distension.     Palpations: Abdomen is soft.     Tenderness: There is no abdominal tenderness. There is no rebound.  Musculoskeletal:        General: No edema.     Cervical back: Normal range of motion.  Skin:    General: Skin is warm and dry.  Neurological:     Mental Status: She is alert and oriented to person, place, and time.     Coordination: Coordination normal.  Psychiatric:        Mood and Affect: Mood and affect normal.     Vitals:   08/02/20 0817  BP: 112/70  Pulse: 68  Temp: 98.4 F (36.9 C)  TempSrc: Oral  SpO2: 98%  Weight: 137 lb (62.1 kg)  Height: 5\' 3"  (1.6 m)    This visit occurred during the SARS-CoV-2 public health emergency.   Safety protocols were in place, including screening questions prior to the visit, additional usage of staff PPE, and extensive cleaning of exam room while observing appropriate contact time as indicated for disinfecting solutions.   Assessment & Plan:

## 2020-08-02 NOTE — Assessment & Plan Note (Signed)
Asked her to have mirena removed as there is a small chance this could be cause. Recently has seen rheumatology and they do not believe that an auto-immune etiology is the cause.

## 2020-08-24 MED FILL — LEVETIRACETAM ER 500 MG TAB: 500 | 90 days supply | Qty: 180 | Fill #0

## 2020-09-05 ENCOUNTER — Encounter: Payer: Self-pay | Admitting: Internal Medicine

## 2020-09-05 ENCOUNTER — Telehealth: Payer: Self-pay | Admitting: Internal Medicine

## 2020-09-05 DIAGNOSIS — Z0000 Encounter for general adult medical examination without abnormal findings: Secondary | ICD-10-CM

## 2020-09-05 DIAGNOSIS — Z111 Encounter for screening for respiratory tuberculosis: Secondary | ICD-10-CM

## 2020-09-05 NOTE — Telephone Encounter (Signed)
See below

## 2020-09-05 NOTE — Telephone Encounter (Signed)
Can have nurse visit

## 2020-09-05 NOTE — Telephone Encounter (Signed)
Can she placed on the lab schedule to have to this done or do you prefer the patient to have a visit? Pleas advise

## 2020-09-05 NOTE — Telephone Encounter (Signed)
Please offer the patient a nurse visit to have this done.

## 2020-09-05 NOTE — Telephone Encounter (Signed)
Just to clarify. Do you want to the patient to have a TB blood or skin test for the nurse visit.

## 2020-09-05 NOTE — Telephone Encounter (Signed)
Patient wondering if she can get her immunization records, she is unsure if she got a few immunizations.  Cell- [phone] Work- [phone]

## 2020-09-05 NOTE — Telephone Encounter (Signed)
I read the prior message wrong, placed the order for the blood test she can get this done.

## 2020-09-05 NOTE — Addendum Note (Signed)
Addended by: Pricilla Holm A on: 09/05/2020 04:15 PM   Modules accepted: Orders

## 2020-09-05 NOTE — Telephone Encounter (Signed)
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 She can be reached at [phone].

## 2020-09-06 ENCOUNTER — Other Ambulatory Visit: Payer: No Typology Code available for payment source

## 2020-09-06 DIAGNOSIS — Z111 Encounter for screening for respiratory tuberculosis: Secondary | ICD-10-CM

## 2020-09-08 ENCOUNTER — Ambulatory Visit: Payer: No Typology Code available for payment source

## 2020-09-08 NOTE — Telephone Encounter (Signed)
Patient neds Varicella order as well.

## 2020-09-08 NOTE — Telephone Encounter (Signed)
Placed orders for varicella antibody so she can come and get both TB and this done.

## 2020-09-08 NOTE — Addendum Note (Signed)
Addended by: Hoyt Koch on: 09/08/2020 09:39 AM   Modules accepted: Orders

## 2020-09-09 LAB — QUANTIFERON-TB GOLD PLUS
Mitogen-NIL: >10 IU/mL
NIL: 0.03 IU/mL
QuantiFERON-TB Gold Plus: NEGATIVE
TB1-NIL: 0 IU/mL
TB2-NIL: <0 IU/mL

## 2020-09-13 ENCOUNTER — Ambulatory Visit: Payer: No Typology Code available for payment source

## 2020-11-20 ENCOUNTER — Other Ambulatory Visit (HOSPITAL_COMMUNITY): Payer: Self-pay

## 2020-11-20 MED FILL — Levetiracetam Tab ER 24HR 500 MG: ORAL | 90 days supply | Qty: 180 | Fill #0 | Status: AC

## 2021-02-22 ENCOUNTER — Other Ambulatory Visit (HOSPITAL_COMMUNITY): Payer: Self-pay

## 2021-02-22 MED FILL — Levetiracetam Tab ER 24HR 500 MG: ORAL | 90 days supply | Qty: 180 | Fill #1 | Status: AC

## 2021-05-22 ENCOUNTER — Other Ambulatory Visit (HOSPITAL_COMMUNITY): Payer: Self-pay

## 2021-05-22 MED FILL — Levetiracetam Tab ER 24HR 500 MG: ORAL | 90 days supply | Qty: 180 | Fill #2 | Status: AC

## 2021-05-30 ENCOUNTER — Telehealth: Payer: Self-pay | Admitting: Neurology

## 2021-05-30 NOTE — Telephone Encounter (Signed)
Patient's appt in November needed rescheduled because Lori [name] will not be back from leave. She is Dr. Jannifer Franklin' patient, so I am putting her on Dr. Jabier Mutton scheduled. Sent mychart message letting her know.

## 2021-06-27 ENCOUNTER — Ambulatory Visit: Payer: No Typology Code available for payment source | Admitting: Neurology

## 2021-06-27 ENCOUNTER — Encounter: Payer: Self-pay | Admitting: Neurology

## 2021-06-27 ENCOUNTER — Other Ambulatory Visit (HOSPITAL_COMMUNITY): Payer: Self-pay

## 2021-06-27 ENCOUNTER — Other Ambulatory Visit: Payer: Self-pay

## 2021-06-27 VITALS — BP 112/70 | HR 64 | Ht 62.0 in | Wt 142.0 lb

## 2021-06-27 DIAGNOSIS — G40309 Generalized idiopathic epilepsy and epileptic syndromes, not intractable, without status epilepticus: Secondary | ICD-10-CM

## 2021-06-27 DIAGNOSIS — Z5181 Encounter for therapeutic drug level monitoring: Secondary | ICD-10-CM | POA: Diagnosis not present

## 2021-06-27 MED ORDER — LEVETIRACETAM ER 500 MG PO TB24
ORAL_TABLET | Freq: Every morning | ORAL | 4 refills | Status: DC
  Filled 2021-06-27: qty 180, fill #0
  Filled 2021-08-28: qty 180, 90d supply, fill #0
  Filled 2021-11-26: qty 180, 90d supply, fill #1
  Filled 2022-01-22 – 2022-02-18 (×2): qty 180, 90d supply, fill #2
  Filled 2022-05-24: qty 180, 90d supply, fill #3

## 2021-06-27 NOTE — Patient Instructions (Addendum)
Continue with Keppra 1000 XR in the morning  Keppra level today  Routine EEG  Return in 1 year    Per [facility] statutes, patients with seizures are not allowed to drive until they have been seizure-free for six months.  Other recommendations include using caution when using heavy equipment or power tools. Avoid working on ladders or at heights. Take showers instead of baths.  Do not swim alone.  Ensure the water temperature is not too high on the home water heater. Do not go swimming alone. Do not lock yourself in a room alone (i.e. bathroom). When caring for infants or small children, sit down when holding, feeding, or changing them to minimize risk of injury to the child in the event you have a seizure. Maintain good sleep hygiene. Avoid alcohol.  Also recommend adequate sleep, hydration, good diet and minimize stress.   During the Seizure  - First, ensure adequate ventilation and place patients on the floor on their left side  Loosen clothing around the neck and ensure the airway is patent. If the patient is clenching the teeth, do not force the mouth open with any object as this can cause severe damage - Remove all items from the surrounding that can be hazardous. The patient may be oblivious to what's happening and may not even know what he or she is doing. If the patient is confused and wandering, either gently guide him/her away and block access to outside areas - Reassure the individual and be comforting - Call 911. In most cases, the seizure ends before EMS arrives. However, there are cases when seizures may last over 3 to 5 minutes. Or the individual may have developed breathing difficulties or severe injuries. If a pregnant patient or a person with diabetes develops a seizure, it is prudent to call an ambulance. - Finally, if the patient does not regain full consciousness, then call EMS. Most patients will remain confused for about 45 to 90 minutes after a seizure, so you must  use judgment in calling for help. - Avoid restraints but make sure the patient is in a bed with padded side rails - Place the individual in a lateral position with the neck slightly flexed; this will help the saliva drain from the mouth and prevent the tongue from falling backward - Remove all nearby furniture and other hazards from the area - Provide verbal assurance as the individual is regaining consciousness - Provide the patient with privacy if possible - Call for help and start treatment as ordered by the caregiver   After the Seizure (Postictal Stage)  After a seizure, most patients experience confusion, fatigue, muscle pain and/or a headache. Thus, one should permit the individual to sleep. For the next few days, reassurance is essential. Being calm and helping reorient the person is also of importance.  Most seizures are painless and end spontaneously. Seizures are not harmful to others but can lead to complications such as stress on the lungs, brain and the heart. Individuals with prior lung problems may develop labored breathing and respiratory distress.

## 2021-06-27 NOTE — Progress Notes (Signed)
PATIENT: Lori [name] DOB: 04-May-1971  REASON FOR VISIT: follow up HISTORY FROM: patient  HISTORY OF PRESENT ILLNESS: Today 06/27/21 Patient presented for follow-up, reports overall that she is doing well.  No seizures since her last visit, in fact her last seizure was in 2012.  She is compliant with the medication, Keppra extended release 100 mg in the morning, denies any side effect from the medication.  Since her last seizure is more than 10 years ago she is interested in coming off medication.  She said at one point she did stop the Keppra for 1 month, no seizure reported.  No other question or concern.    HISTORY 06/26/2020 Lori [name] is a 50 year old female with history of seizures, well controlled on Keppra.  Last seizure was in 2012, have been tonic-clonic, losing control of her bladder and biting her tongue.  She is taking Keppra XR 1000 mg in the morning.  Reports if she forgets a dose, the next morning she may have brain fog, can't sleep that night.  Has seen PCP for fatigue, shoulder pain, improved with meloxicam.  ESR was elevated.  Pending appointment with rheumatology.  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  Presents today for evaluation unaccompanied.  HISTORY 06/24/2019 SS: Lori [name] is a 50 year old female with history of seizures that have been well controlled on Keppra.  She is taking extended release Keppra 500 mg, in September 2019, she decreased her dose to 500 mg daily, but her higher maintenance dose was increased after last visit to 1000 mg daily.  Her last seizure occurred in 2012.  She has not had recurrent seizure.  She is tolerating Keppra without side effect.  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  She drives a car without difficulty.  She denies any trouble with her mood, balance, or gait.  She denies any new problems or concerns.  She does have chronic allergies.  She presents today for follow-up  unaccompanied.   REVIEW OF SYSTEMS: Out of a complete 14 system review of symptoms, the patient complains only of the following symptoms, and all other reviewed systems are negative.  Seizure  ALLERGIES: No Known Allergies  HOME MEDICATIONS: Outpatient Medications Prior to Visit  Medication Sig Dispense Refill   cholecalciferol (VITAMIN D) 1000 UNITS tablet Take 5,000 Units by mouth daily.     fluticasone (FLONASE) 50 MCG/ACT nasal spray Place 2 sprays into both nostrils daily. (Patient taking differently: Place 2 sprays into both nostrils as needed.) 16 g 6   levETIRAcetam (KEPPRA XR) 500 MG 24 hr tablet TAKE 2 TABLETS BY MOUTH EVERY MORNING 180 tablet 4   loratadine (CLARITIN) 10 MG tablet Take 10 mg by mouth daily as needed for allergies.     Multiple Vitamin (MULTIVITAMIN) tablet Take 1 tablet by mouth daily.     TART CHERRY PO Take by mouth.     Turmeric 500 MG TABS Take by mouth.     No facility-administered medications prior to visit.    PAST MEDICAL HISTORY: Past Medical History:  Diagnosis Date   Seizure ([place])     PAST SURGICAL HISTORY: No past surgical history on file.  FAMILY HISTORY: Family History  Problem Relation Age of Onset   Diabetes Father    Anemia Father    Seizures Neg Hx     SOCIAL HISTORY: Social History   Socioeconomic History   Marital status: Married    Spouse name:  Not on file   Number of children: 2   Years of education: Not on file   Highest education level: Not on file  Occupational History    Employer: [employer]  Tobacco Use   Smoking status: Never   Smokeless tobacco: Never  Vaping Use   Vaping Use: Never used  Substance and Sexual Activity   Alcohol use: No   Drug use: No   Sexual activity: Not on file  Other Topics Concern   Not on file  Social History Narrative   Patient is married and has 2 children.    Right-handed   Caffeine: 1 1/2 cups of tea per day   Social Determinants of Health   Financial Resource  Strain: Not on file  Food Insecurity: Not on file  Transportation Needs: Not on file  Physical Activity: Not on file  Stress: Not on file  Social Connections: Not on file  Intimate Partner Violence: Not on file   PHYSICAL EXAM  There were no vitals filed for this visit.  There is no height or weight on file to calculate BMI.  Generalized: Well developed, in no acute distress   Neurological examination  Mentation: Alert oriented to time, place, history taking. Follows all commands speech and language fluent Cranial nerve II-XII: Pupils were equal round reactive to light. Extraocular movements were full, visual field were full on confrontational test. Facial sensation and strength were normal. Head turning and shoulder shrug  were normal and symmetric. Motor: The motor testing reveals 5 over 5 strength of all 4 extremities. Good symmetric motor tone is noted throughout.  Sensory: Sensory testing is intact to soft touch on all 4 extremities. No evidence of extinction is noted.  Coordination: Cerebellar testing reveals good finger-nose-finger and heel-to-shin bilaterally.  Gait and station: Gait is normal.  Reflexes: Deep tendon reflexes are symmetric and normal bilaterally.   DIAGNOSTIC DATA (LABS, IMAGING, TESTING) - I reviewed patient records, labs, notes, testing and imaging myself where available.  Lab Results  Component Value Date   WBC 6.3 06/16/2020   HGB 13.3 06/16/2020   HCT 40.7 06/16/2020   MCV 88.6 06/16/2020   PLT 233.0 06/16/2020      Component Value Date/Time   NA 140 06/16/2020 0902   K 3.8 06/16/2020 0902   CL 103 06/16/2020 0902   CO2 29 06/16/2020 0902   GLUCOSE 101 (H) 06/16/2020 0902   BUN 12 06/16/2020 0902   CREATININE 0.72 06/16/2020 0902   CALCIUM 9.6 06/16/2020 0902   PROT 7.8 06/16/2020 0902   ALBUMIN 4.3 06/16/2020 0902   AST 17 06/16/2020 0902   ALT 13 06/16/2020 0902   ALKPHOS 71 06/16/2020 0902   BILITOT 0.6 06/16/2020 0902   GFRNONAA  >60 09/21/2010 0630   GFRAA  09/21/2010 0630    >60        The eGFR has been calculated using the MDRD equation. This calculation has not been validated in all clinical situations. eGFR's persistently <60 mL/min signify possible Chronic Kidney Disease.   Lab Results  Component Value Date   CHOL 172 08/02/2020   HDL 52.30 08/02/2020   LDLCALC 105 (H) 08/02/2020   TRIG 74.0 08/02/2020   CHOLHDL 3 08/02/2020   Lab Results  Component Value Date   HGBA1C 5.8 08/02/2020   Lab Results  Component Value Date   RSWNIOEV03 500 06/02/2019   Lab Results  Component Value Date   TSH 1.13 06/16/2020    ASSESSMENT AND PLAN 50 y.o. year  old female  has a past medical history of Seizure ([place]). here with:  1.  Seizure disorder -Last seizure 2012, well controlled on Keppra -Continue Keppra XR 500 mg, 2 tablets daily - Will obtain a Keppra level today and routine EEG.  -Patient is interested in a coming off medication, I think is reasonable since her last seizure was more than 10 years ago.  I explained to the patient that during this phase of medication withdrawal, she will need to stop driving for 6 months.  She said that she work full-time and currently does not have anyone to drive her to work and she also work alone, does not have anybody in her workspace.  She is considering coming off medication but need more time to think about it. When she is ready, I will order a 24 hrs EEG.  -Call for seizure activity, follow-up 1 year or sooner if needed    Alric Ran, MD 06/27/2021, 9:50 AM [facility] [address] [place], [place] [address] [phone]

## 2021-06-29 LAB — LEVETIRACETAM LEVEL: Levetiracetam Lvl: 27.8 ug/mL (ref 10.0–40.0)

## 2021-07-02 ENCOUNTER — Other Ambulatory Visit: Payer: No Typology Code available for payment source | Admitting: *Deleted

## 2021-07-11 ENCOUNTER — Other Ambulatory Visit (HOSPITAL_COMMUNITY): Payer: Self-pay

## 2021-07-11 MED ORDER — DEXAMETHASONE 0.5 MG/5ML PO SOLN
ORAL | 1 refills | Status: DC
  Filled 2021-07-11: qty 240, 12d supply, fill #0

## 2021-08-15 ENCOUNTER — Telehealth: Payer: Self-pay | Admitting: Neurology

## 2021-08-15 NOTE — Telephone Encounter (Signed)
Send pt mychart message with Dr. April Manson as treating physician. This should hopefully help this issue.

## 2021-08-15 NOTE — Telephone Encounter (Signed)
Pt called trying to send a Mychart message, she was informed by Mychart IT that we have to add Dr. April Manson to her Mychart. Pt requesting a call once this is completed.

## 2021-08-16 ENCOUNTER — Encounter: Payer: Self-pay | Admitting: Internal Medicine

## 2021-08-16 ENCOUNTER — Ambulatory Visit (INDEPENDENT_AMBULATORY_CARE_PROVIDER_SITE_OTHER): Payer: No Typology Code available for payment source | Admitting: Internal Medicine

## 2021-08-16 ENCOUNTER — Other Ambulatory Visit: Payer: Self-pay

## 2021-08-16 ENCOUNTER — Other Ambulatory Visit (HOSPITAL_COMMUNITY): Payer: Self-pay

## 2021-08-16 ENCOUNTER — Telehealth: Payer: Self-pay | Admitting: Neurology

## 2021-08-16 ENCOUNTER — Other Ambulatory Visit (INDEPENDENT_AMBULATORY_CARE_PROVIDER_SITE_OTHER): Payer: No Typology Code available for payment source

## 2021-08-16 VITALS — BP 100/60 | HR 79 | Temp 98.9°F | Ht 62.0 in | Wt 138.0 lb

## 2021-08-16 DIAGNOSIS — R7303 Prediabetes: Secondary | ICD-10-CM

## 2021-08-16 DIAGNOSIS — R700 Elevated erythrocyte sedimentation rate: Secondary | ICD-10-CM | POA: Diagnosis not present

## 2021-08-16 DIAGNOSIS — Z0000 Encounter for general adult medical examination without abnormal findings: Secondary | ICD-10-CM

## 2021-08-16 DIAGNOSIS — E559 Vitamin D deficiency, unspecified: Secondary | ICD-10-CM

## 2021-08-16 LAB — COMPREHENSIVE METABOLIC PANEL
ALT: 14 U/L (ref 0–35)
AST: 18 U/L (ref 0–37)
Albumin: 4.2 g/dL (ref 3.5–5.2)
Alkaline Phosphatase: 70 U/L (ref 39–117)
BUN: 9 mg/dL (ref 6–23)
CO2: 29 mEq/L (ref 19–32)
Calcium: 9.8 mg/dL (ref 8.4–10.5)
Chloride: 103 mEq/L (ref 96–112)
Creatinine, Ser: 0.77 mg/dL (ref 0.40–1.20)
GFR: 89.49 mL/min (ref >60.00)
Glucose, Bld: 88 mg/dL (ref 70–99)
Potassium: 3.7 mEq/L (ref 3.5–5.1)
Sodium: 139 mEq/L (ref 135–145)
Total Bilirubin: 0.6 mg/dL (ref 0.2–1.2)
Total Protein: 7.9 g/dL (ref 6.0–8.3)

## 2021-08-16 LAB — VITAMIN D 25 HYDROXY (VIT D DEFICIENCY, FRACTURES): VITD: 53.92 ng/mL (ref 30.00–100.00)

## 2021-08-16 LAB — LIPID PANEL
Cholesterol: 182 mg/dL (ref 0–200)
HDL: 52.6 mg/dL (ref >39.00)
LDL Cholesterol: 115 mg/dL — ABNORMAL HIGH (ref 0–99)
NonHDL: 128.95
Total CHOL/HDL Ratio: 3
Triglycerides: 70 mg/dL (ref 0.0–149.0)
VLDL: 14 mg/dL (ref 0.0–40.0)

## 2021-08-16 LAB — CBC
HCT: 41.9 % (ref 36.0–46.0)
Hemoglobin: 13.5 g/dL (ref 12.0–15.0)
MCHC: 32.2 g/dL (ref 30.0–36.0)
MCV: 89.1 fl (ref 78.0–100.0)
Platelets: 224 10*3/uL (ref 150.0–400.0)
RBC: 4.7 Mil/uL (ref 3.87–5.11)
RDW: 13.1 % (ref 11.5–15.5)
WBC: 5.7 10*3/uL (ref 4.0–10.5)

## 2021-08-16 LAB — TSH: TSH: 2.02 u[IU]/mL (ref 0.35–5.50)

## 2021-08-16 LAB — SEDIMENTATION RATE: Sed Rate: 64 mm/hr — ABNORMAL HIGH (ref 0–30)

## 2021-08-16 LAB — HEMOGLOBIN A1C: Hgb A1c MFr Bld: 6.1 % (ref 4.6–6.5)

## 2021-08-16 MED ORDER — BETAMETHASONE VALERATE 0.1 % EX OINT
1.0000 "application " | TOPICAL_OINTMENT | Freq: Two times a day (BID) | CUTANEOUS | 0 refills | Status: DC
  Filled 2021-08-16: qty 90, 30d supply, fill #0

## 2021-08-16 NOTE — Telephone Encounter (Signed)
I have sent a mychart message to the pt on this.

## 2021-08-16 NOTE — Assessment & Plan Note (Signed)
Checking HgA1c. 

## 2021-08-16 NOTE — Progress Notes (Signed)
° °  Subjective:   Patient ID: Lori [name], female    DOB: 05/23/1971, 51 y.o.   MRN: [id number]  HPI The patient is a 51 YO female coming in for physical.   PMH, [place], social history reviewed and updated  Review of Systems  Constitutional: Negative.   HENT: Negative.    Eyes: Negative.   Respiratory:  Negative for cough, chest tightness and shortness of breath.   Cardiovascular:  Negative for chest pain, palpitations and leg swelling.  Gastrointestinal:  Negative for abdominal distention, abdominal pain, constipation, diarrhea, nausea and vomiting.  Musculoskeletal: Negative.   Skin: Negative.   Neurological: Negative.   Psychiatric/Behavioral: Negative.     Objective:  Physical Exam Constitutional:      Appearance: She is well-developed.  HENT:     Head: Normocephalic and atraumatic.  Cardiovascular:     Rate and Rhythm: Normal rate and regular rhythm.  Pulmonary:     Effort: Pulmonary effort is normal. No respiratory distress.     Breath sounds: Normal breath sounds. No wheezing or rales.  Abdominal:     General: Bowel sounds are normal. There is no distension.     Palpations: Abdomen is soft.     Tenderness: There is no abdominal tenderness. There is no rebound.  Musculoskeletal:     Cervical back: Normal range of motion.  Skin:    General: Skin is warm and dry.  Neurological:     Mental Status: She is alert and oriented to person, place, and time.     Coordination: Coordination normal.    Vitals:   08/16/21 0806  BP: 100/60  Pulse: 79  Temp: 98.9 F (37.2 C)  TempSrc: Oral  SpO2: 98%  Weight: 138 lb (62.6 kg)  Height: 5\' 2"  (1.575 m)    This visit occurred during the SARS-CoV-2 public health emergency.  Safety protocols were in place, including screening questions prior to the visit, additional usage of staff PPE, and extensive cleaning of exam room while observing appropriate contact time as indicated for disinfecting solutions.   Assessment & Plan:

## 2021-08-16 NOTE — Assessment & Plan Note (Signed)
Checking vitamin-D.

## 2021-08-16 NOTE — Patient Instructions (Signed)
Think about the cologuard for colon cancer screening.

## 2021-08-16 NOTE — Assessment & Plan Note (Signed)
Checking ESR

## 2021-08-16 NOTE — Assessment & Plan Note (Signed)
Flu shot up to date. Covid-19 counseled. Shingrix declines. Tetanus up to date. Colonoscopy counseled and declines for now. Mammogram up to date with gyn, pap smear up to date with gyn. Counseled about sun safety and mole surveillance. Counseled about the dangers of distracted driving. Given 10 year screening recommendations.

## 2021-08-17 ENCOUNTER — Encounter: Payer: Self-pay | Admitting: Internal Medicine

## 2021-08-17 ENCOUNTER — Other Ambulatory Visit (HOSPITAL_COMMUNITY): Payer: Self-pay

## 2021-08-20 ENCOUNTER — Ambulatory Visit: Payer: No Typology Code available for payment source | Admitting: Neurology

## 2021-08-20 DIAGNOSIS — G40309 Generalized idiopathic epilepsy and epileptic syndromes, not intractable, without status epilepticus: Secondary | ICD-10-CM

## 2021-08-20 NOTE — Procedures (Signed)
° ° °  History:  51 year old woman with epilepsy   EEG classification: Awake and drowsy  Description of the recording: The background rhythms of this recording consists of a fairly well modulated medium amplitude alpha rhythm of 10-11 Hz that is reactive to eye opening and closure. As the record progresses, the patient appears to remain in the waking state throughout the recording. Photic stimulation was performed, did not show any abnormalities. Hyperventilation was also performed, did not show any abnormalities. Toward the end of the recording, the patient enters the drowsy state with slight symmetric slowing seen. The patient never enters stage II sleep. There was presence of bitemporal sharps. There was no focal slowing. EKG monitor shows no evidence of cardiac rhythm abnormalities with a heart rate of 66.  Impression: This is an abnormal EEG recording in the waking and drowsy state due to presence of bitemporal sharps. Bitemporal sharps are consistent with an area of epileptogenic potential in the bilateral temporal regions.   Alric Ran, MD [facility]

## 2021-08-28 ENCOUNTER — Other Ambulatory Visit (HOSPITAL_COMMUNITY): Payer: Self-pay

## 2021-09-05 ENCOUNTER — Telehealth: Payer: Self-pay | Admitting: Internal Medicine

## 2021-09-05 ENCOUNTER — Telehealth: Payer: Self-pay | Admitting: Neurology

## 2021-09-05 ENCOUNTER — Encounter: Payer: Self-pay | Admitting: *Deleted

## 2021-09-05 NOTE — Telephone Encounter (Signed)
Pt states she was told by my chart help desk to call [facility] to have RN add Dr April Manson to her Deloris Ping so she is able to message him

## 2021-09-05 NOTE — Telephone Encounter (Signed)
Dr. April Manson has already been added to her care team but the patient is still not able to send mychart messages to him. I told her I would put in a ticket to our Epic team for help. She did not need anything at the time we were on the phone. In the meantime, she may reach [place] by phone or send the messages for Lori [name] attention (shared patient).

## 2021-09-05 NOTE — Telephone Encounter (Signed)
See my chart message

## 2021-09-05 NOTE — Telephone Encounter (Signed)
Patient called the office stating she no longer sees Dr. Marveen Reeks name in her MyChart to be able to send him a message. Patient states she got a hold of the MyChart help line and they advised her to contact the office to have Dr. Benjamine Mola added to her MyChart.

## 2021-09-07 ENCOUNTER — Other Ambulatory Visit (HOSPITAL_COMMUNITY): Payer: Self-pay

## 2021-09-07 ENCOUNTER — Other Ambulatory Visit (INDEPENDENT_AMBULATORY_CARE_PROVIDER_SITE_OTHER): Payer: No Typology Code available for payment source

## 2021-09-07 ENCOUNTER — Encounter: Payer: Self-pay | Admitting: Internal Medicine

## 2021-09-07 DIAGNOSIS — M25512 Pain in left shoulder: Secondary | ICD-10-CM | POA: Diagnosis not present

## 2021-09-07 DIAGNOSIS — M25511 Pain in right shoulder: Secondary | ICD-10-CM

## 2021-09-07 DIAGNOSIS — G8929 Other chronic pain: Secondary | ICD-10-CM

## 2021-09-07 DIAGNOSIS — Z0000 Encounter for general adult medical examination without abnormal findings: Secondary | ICD-10-CM

## 2021-09-07 LAB — SEDIMENTATION RATE: Sed Rate: 53 mm/hr — ABNORMAL HIGH (ref 0–30)

## 2021-09-07 MED ORDER — MELOXICAM 15 MG PO TABS
15.0000 mg | ORAL_TABLET | Freq: Every day | ORAL | 0 refills | Status: DC
  Filled 2021-09-07: qty 30, 30d supply, fill #0

## 2021-11-26 ENCOUNTER — Other Ambulatory Visit (HOSPITAL_COMMUNITY): Payer: Self-pay

## 2022-01-22 ENCOUNTER — Other Ambulatory Visit (HOSPITAL_COMMUNITY): Payer: Self-pay

## 2022-02-07 ENCOUNTER — Encounter: Payer: Self-pay | Admitting: Internal Medicine

## 2022-02-07 ENCOUNTER — Other Ambulatory Visit (HOSPITAL_COMMUNITY): Payer: Self-pay

## 2022-02-07 ENCOUNTER — Ambulatory Visit (INDEPENDENT_AMBULATORY_CARE_PROVIDER_SITE_OTHER): Payer: No Typology Code available for payment source | Admitting: Internal Medicine

## 2022-02-07 DIAGNOSIS — L439 Lichen planus, unspecified: Secondary | ICD-10-CM | POA: Diagnosis not present

## 2022-02-07 MED ORDER — FLUOCINONIDE 0.05 % EX GEL
1.0000 | Freq: Every day | CUTANEOUS | 5 refills | Status: DC
  Filled 2022-02-07: qty 60, 12d supply, fill #0
  Filled 2022-05-24: qty 60, 12d supply, fill #1

## 2022-02-07 NOTE — Progress Notes (Signed)
   Subjective:   Patient ID: Lori [name], female    DOB: 1971/04/11, 51 y.o.   MRN: [id number]  HPI The patient is a 51 YO female coming in for concerns. 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  Review of Systems  Constitutional: Negative.   HENT:  Positive for dental problem.   Eyes: Negative.   Respiratory:  Negative for cough, chest tightness and shortness of breath.   Cardiovascular:  Negative for chest pain, palpitations and leg swelling.  Gastrointestinal:  Negative for abdominal distention, abdominal pain, constipation, diarrhea, nausea and vomiting.  Musculoskeletal: Negative.   Skin: Negative.   Neurological: Negative.   Psychiatric/Behavioral: Negative.      Objective:  Physical Exam Constitutional:      Appearance: She is well-developed.  HENT:     Head: Normocephalic and atraumatic.     Comments: Mouth and tongue without white plaque, some red changes around the gums Cardiovascular:     Rate and Rhythm: Normal rate and regular rhythm.  Pulmonary:     Effort: Pulmonary effort is normal. No respiratory distress.     Breath sounds: Normal breath sounds. No wheezing or rales.  Abdominal:     General: Bowel sounds are normal. There is no distension.     Palpations: Abdomen is soft.     Tenderness: There is no abdominal tenderness. There is no rebound.  Musculoskeletal:     Cervical back: Normal range of motion.  Skin:    General: Skin is warm and dry.  Neurological:     Mental Status: She is alert and oriented to person, place, and time.     Coordination: Coordination normal.     Vitals:   02/07/22 0805  BP: 112/62  Pulse: 64  Resp: 18  SpO2: 97%  Weight: 143 lb (64.9 kg)  Height: '5\' 2"'$  (1.575 m)    Assessment & Plan:

## 2022-02-07 NOTE — Patient Instructions (Signed)
We have sent in a gel medicine to use on the gums and mouth 5 times a day for 9 weeks.

## 2022-02-08 ENCOUNTER — Ambulatory Visit: Payer: No Typology Code available for payment source | Admitting: Internal Medicine

## 2022-02-08 ENCOUNTER — Encounter: Payer: Self-pay | Admitting: Internal Medicine

## 2022-02-08 ENCOUNTER — Other Ambulatory Visit (HOSPITAL_COMMUNITY): Payer: Self-pay

## 2022-02-08 NOTE — Assessment & Plan Note (Signed)
I do not appreciate typical lesions in her mouth however this is working dx by her dentist. Will rx fluocinonide gel to use topically in the mouth around the gums 5 times a day for 9 weeks. If no improvement this likely is not accurate diagnosis. We did discuss if this is diagnosis this is likely to resolve spontaneously but could take several years. This seems more likely post-covid change to taste sensation as her main symptom is burning with spicy or even mildly spicy foods.

## 2022-02-18 ENCOUNTER — Other Ambulatory Visit (HOSPITAL_COMMUNITY): Payer: Self-pay

## 2022-03-12 ENCOUNTER — Other Ambulatory Visit (HOSPITAL_COMMUNITY): Payer: Self-pay

## 2022-03-12 MED ORDER — CLOTRIMAZOLE-BETAMETHASONE 1-0.05 % EX CREA
TOPICAL_CREAM | CUTANEOUS | 2 refills | Status: DC
  Filled 2022-03-12: qty 45, 30d supply, fill #0

## 2022-04-24 NOTE — Progress Notes (Deleted)
Office Visit Note  Patient: Lori [name]             Date of Birth: 09-28-1970           MRN: [id number]             PCP: Hoyt Koch, MD Referring: Hoyt Koch, * Visit Date: 05/01/2022   Subjective:  No chief complaint on file.   History of Present Illness: [name] is a 51 y.o. female here for follow up for evaluation of elevated sedimentation rate checked in association with fatigue and bilateral shoulder pain   Previous HPI 07/26/2022 [name] is a 51 y.o. female here for evaluation of elevated sedimentation rate checked in association with fatigue and bilateral shoulder pain. These symptoms started around October with R>L shoulder pain that was continuous but worsened with use and when lying on a side at night. Before this she generally did not experience daily pain. She had a previous episode of joint pain last fall with almost a year between issues. She started taking meloxicam as recommended and noticed about a 90% improvement in shoulder pain with 2 weeks of this medication. She does still have fatigue.   Activities of Daily Living:  Patient reports morning stiffness for 0 minutes.   Patient Denies nocturnal pain.  Difficulty dressing/grooming: Denies Difficulty climbing stairs: Denies Difficulty getting out of chair: Denies Difficulty using hands for taps, buttons, cutlery, and/or writing: Denies     No Rheumatology ROS completed.   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 Social History   Social History Narrative   Patient is married and has 2 children.    Right-handed   Caffeine: 1 1/2 cups of tea per day   Immunization History  Administered Date(s) Administered   Influenza Whole 05/12/2014   Influenza,inj,Quad PF,6+ Mos 05/13/2015, 05/27/2016, 04/14/2019, 05/12/2021   Influenza-Unspecified 05/13/2017, 05/13/2018, 05/29/2020   PFIZER(Purple Top)SARS-COV-2 Vaccination 08/14/2019, 09/02/2019   Tdap 04/25/2015     Objective: Vital Signs: There were no vitals taken for this visit.   Physical Exam   Musculoskeletal Exam: ***  CDAI Exam: CDAI Score: -- Patient Global: --; Provider Global: -- Swollen: --; Tender: -- Joint Exam 05/01/2022   No joint exam has been documented for this visit   There is currently no information documented on the homunculus. Go to the Rheumatology activity and complete the homunculus joint exam.  Investigation: No additional findings.  Imaging: No results found.  Recent Labs: Lab Results  Component Value Date   WBC 5.7 08/16/2021   HGB 13.5 08/16/2021   PLT 224.0 08/16/2021   NA 139 08/16/2021   K 3.7 08/16/2021   CL 103 08/16/2021   CO2 29 08/16/2021  GLUCOSE 88 08/16/2021   BUN 9 08/16/2021   CREATININE 0.77 08/16/2021   BILITOT 0.6 08/16/2021   ALKPHOS 70 08/16/2021   AST 18 08/16/2021   ALT 14 08/16/2021   PROT 7.9 08/16/2021   ALBUMIN 4.2 08/16/2021   CALCIUM 9.8 08/16/2021   GFRAA  09/21/2010    >60        The eGFR has been calculated using the MDRD equation. This calculation has not been validated in all clinical situations. eGFR's persistently <60 mL/min signify possible Chronic Kidney Disease.   QFTBGOLDPLUS NEGATIVE 09/06/2020    Speciality  Comments: No specialty comments available.  Procedures:  No procedures performed Allergies: Patient has no known allergies.   Assessment / Plan:     Visit Diagnoses: No diagnosis found.  ***  Orders: No orders of the defined types were placed in this encounter.  No orders of the defined types were placed in this encounter.    Follow-Up Instructions: No follow-ups on file.   Bertram Savin, RT  Note - This record has been created using Editor, commissioning.  Chart creation errors have been sought, but may not always  have been located. Such creation errors do not reflect on  the standard of medical care.

## 2022-04-25 ENCOUNTER — Ambulatory Visit (INDEPENDENT_AMBULATORY_CARE_PROVIDER_SITE_OTHER): Payer: No Typology Code available for payment source

## 2022-04-25 ENCOUNTER — Encounter: Payer: Self-pay | Admitting: Internal Medicine

## 2022-04-25 ENCOUNTER — Ambulatory Visit (INDEPENDENT_AMBULATORY_CARE_PROVIDER_SITE_OTHER): Payer: No Typology Code available for payment source | Admitting: Internal Medicine

## 2022-04-25 ENCOUNTER — Other Ambulatory Visit (INDEPENDENT_AMBULATORY_CARE_PROVIDER_SITE_OTHER): Payer: No Typology Code available for payment source

## 2022-04-25 VITALS — BP 98/62 | HR 78 | Temp 98.3°F | Ht 62.0 in | Wt 142.0 lb

## 2022-04-25 DIAGNOSIS — G8929 Other chronic pain: Secondary | ICD-10-CM

## 2022-04-25 DIAGNOSIS — M25512 Pain in left shoulder: Secondary | ICD-10-CM | POA: Diagnosis not present

## 2022-04-25 DIAGNOSIS — R5383 Other fatigue: Secondary | ICD-10-CM

## 2022-04-25 DIAGNOSIS — R7303 Prediabetes: Secondary | ICD-10-CM

## 2022-04-25 DIAGNOSIS — M79645 Pain in left finger(s): Secondary | ICD-10-CM

## 2022-04-25 DIAGNOSIS — R700 Elevated erythrocyte sedimentation rate: Secondary | ICD-10-CM

## 2022-04-25 DIAGNOSIS — M25511 Pain in right shoulder: Secondary | ICD-10-CM

## 2022-04-25 LAB — CBC
HCT: 40.6 % (ref 36.0–46.0)
Hemoglobin: 13.3 g/dL (ref 12.0–15.0)
MCHC: 32.7 g/dL (ref 30.0–36.0)
MCV: 88.9 fl (ref 78.0–100.0)
Platelets: 208 10*3/uL (ref 150.0–400.0)
RBC: 4.56 Mil/uL (ref 3.87–5.11)
RDW: 13.1 % (ref 11.5–15.5)
WBC: 5.6 10*3/uL (ref 4.0–10.5)

## 2022-04-25 LAB — COMPREHENSIVE METABOLIC PANEL
ALT: 13 U/L (ref 0–35)
AST: 16 U/L (ref 0–37)
Albumin: 4.1 g/dL (ref 3.5–5.2)
Alkaline Phosphatase: 79 U/L (ref 39–117)
BUN: 10 mg/dL (ref 6–23)
CO2: 30 mEq/L (ref 19–32)
Calcium: 9.5 mg/dL (ref 8.4–10.5)
Chloride: 103 mEq/L (ref 96–112)
Creatinine, Ser: 0.76 mg/dL (ref 0.40–1.20)
GFR: 90.47 mL/min (ref >60.00)
Glucose, Bld: 94 mg/dL (ref 70–99)
Potassium: 4 mEq/L (ref 3.5–5.1)
Sodium: 140 mEq/L (ref 135–145)
Total Bilirubin: 0.5 mg/dL (ref 0.2–1.2)
Total Protein: 7.9 g/dL (ref 6.0–8.3)

## 2022-04-25 LAB — C-REACTIVE PROTEIN: CRP: <1 mg/dL (ref 0.5–20.0)

## 2022-04-25 LAB — FERRITIN: Ferritin: 28.2 ng/mL (ref 10.0–291.0)

## 2022-04-25 LAB — VITAMIN D 25 HYDROXY (VIT D DEFICIENCY, FRACTURES): VITD: 42.38 ng/mL (ref 30.00–100.00)

## 2022-04-25 LAB — HEMOGLOBIN A1C: Hgb A1c MFr Bld: 6.3 % (ref 4.6–6.5)

## 2022-04-25 LAB — TSH: TSH: 3.13 u[IU]/mL (ref 0.35–5.50)

## 2022-04-25 LAB — SEDIMENTATION RATE: Sed Rate: 41 mm/hr — ABNORMAL HIGH (ref 0–30)

## 2022-04-25 LAB — VITAMIN B12: Vitamin B-12: 498 pg/mL (ref 211–911)

## 2022-04-25 NOTE — Patient Instructions (Signed)
We will check the labs today and want you to keep the visit with the rheumatologist.  We are checking the x-ray of the hand.

## 2022-04-25 NOTE — Progress Notes (Unsigned)
   Subjective:   Patient ID: Lori [name], female    DOB: [birth year], 51 y.o.   MRN: [id number]  HPI The patient is a 51 YO female coming in for rash and fatigue.  Review of Systems  Constitutional:  Positive for fatigue.  HENT: Negative.    Eyes: Negative.   Respiratory:  Negative for cough, chest tightness and shortness of breath.   Cardiovascular:  Negative for chest pain, palpitations and leg swelling.  Gastrointestinal:  Negative for abdominal distention, abdominal pain, constipation, diarrhea, nausea and vomiting.  Musculoskeletal:  Positive for myalgias.  Skin:  Positive for rash.  Neurological: Negative.   Psychiatric/Behavioral: Negative.      Objective:  Physical Exam Constitutional:      Appearance: She is well-developed.  HENT:     Head: Normocephalic and atraumatic.  Cardiovascular:     Rate and Rhythm: Normal rate and regular rhythm.  Pulmonary:     Effort: Pulmonary effort is normal. No respiratory distress.     Breath sounds: Normal breath sounds. No wheezing or rales.  Abdominal:     General: Bowel sounds are normal. There is no distension.     Palpations: Abdomen is soft.     Tenderness: There is no abdominal tenderness. There is no rebound.  Musculoskeletal:     Cervical back: Normal range of motion.  Skin:    General: Skin is warm and dry.     Findings: Rash present.     Comments: Purple rash on the mid back and the lower abdomen  Neurological:     Mental Status: She is alert and oriented to person, place, and time.     Coordination: Coordination normal.     Vitals:   04/25/22 0808  BP: 98/62  Pulse: 78  Temp: 98.3 F (36.8 C)  SpO2: 98%  Weight: 142 lb (64.4 kg)  Height: '5\' 2"'$  (1.575 m)    Assessment & Plan:

## 2022-04-26 ENCOUNTER — Encounter: Payer: Self-pay | Admitting: Internal Medicine

## 2022-04-26 DIAGNOSIS — G8929 Other chronic pain: Secondary | ICD-10-CM | POA: Insufficient documentation

## 2022-04-26 NOTE — Assessment & Plan Note (Signed)
Given location and history suspect arthritis. Checking x-ray left hand to evaluate.

## 2022-04-26 NOTE — Assessment & Plan Note (Signed)
Checking CMP, CBC, vitamin D, ferritin, HgA1c, vitamin B12. Adjust as needed.

## 2022-04-26 NOTE — Assessment & Plan Note (Signed)
Checking ESR and CRP to see if these are concurrently elevated as she is having her shoulder and arm pain which she associates with this previously. She does have violaceous rash but not in typical distribution for dermatomyositis.

## 2022-04-29 NOTE — Telephone Encounter (Signed)
Pt is still experiencing pain & fatigue and is requesting meloxicam prescription.   Pt is ok with nutritionist referral.

## 2022-04-30 ENCOUNTER — Other Ambulatory Visit (HOSPITAL_COMMUNITY): Payer: Self-pay

## 2022-04-30 MED ORDER — MELOXICAM 15 MG PO TABS
15.0000 mg | ORAL_TABLET | Freq: Every day | ORAL | 0 refills | Status: DC
  Filled 2022-04-30: qty 30, 30d supply, fill #0

## 2022-05-01 ENCOUNTER — Ambulatory Visit: Payer: No Typology Code available for payment source | Admitting: Internal Medicine

## 2022-05-01 DIAGNOSIS — R700 Elevated erythrocyte sedimentation rate: Secondary | ICD-10-CM

## 2022-05-01 DIAGNOSIS — G8929 Other chronic pain: Secondary | ICD-10-CM

## 2022-05-24 ENCOUNTER — Other Ambulatory Visit (HOSPITAL_COMMUNITY): Payer: Self-pay

## 2022-05-31 ENCOUNTER — Encounter: Payer: Self-pay | Admitting: Neurology

## 2022-06-27 ENCOUNTER — Telehealth: Payer: Self-pay | Admitting: Neurology

## 2022-06-27 NOTE — Telephone Encounter (Signed)
FYI: Called pt to reschedule 11/20 appt (MD emergency) and pt became very upset over the next available date of 09/09/22. She began speaking over me loudly and states her insurance runs out this year and she cannot wait until January. She requested to follow up with Judson Roch so she did not have to wait as long. Scheduled with Judson Roch for 12/05 at 10:15 am. She states she has been stable and is just checking in for a yearly appt.

## 2022-06-27 NOTE — Telephone Encounter (Signed)
Thanks

## 2022-07-01 ENCOUNTER — Ambulatory Visit: Payer: No Typology Code available for payment source | Admitting: Neurology

## 2022-07-01 NOTE — Progress Notes (Unsigned)
Patient: Lori [name] Date of Birth: 03/23/1971  Reason for Visit: Follow up History from: Patient Primary Neurologist: [facility]   ASSESSMENT AND PLAN 51 y.o. year old female   1.  Seizure disorder -Continue Keppra XR 500 mg, 2 tablets daily -Last seizure was in 2012 -Follow-up in 1 year or sooner if needed, she mentioned high co-pay with new insurance next year, if PCP is willing to prescribe Keppra I am okay with this, she can return here if needed  HISTORY OF PRESENT ILLNESS: Today 07/02/22 EEG in January 2023 was abnormal due to presence of bitemporal sharps.  Bitemporal sharps are consistent with an area of epileptogenic potential in the bilateral temporal regions.  Keppra level in November 2022 was 27.8. It was considered to taper off Keppra, but we decided to continue due to abnormal EEG.  She remains on Keppra, no issues.  Last seizure was in 2012.  HISTORY 06/27/21 Dr. April Manson: Patient presented for follow-up, reports overall that she is doing well.  No seizures since her last visit, in fact her last seizure was in 2012.  She is compliant with the medication, Keppra extended release 100 mg in the morning, denies any side effect from the medication.  Since her last seizure is more than 10 years ago she is interested in coming off medication.  She said at one point she did stop the Keppra for 1 month, no seizure reported.  No other question or concern.      REVIEW OF SYSTEMS: Out of a complete 14 system review of symptoms, the patient complains only of the following symptoms, and all other reviewed systems are negative.  See HPI  ALLERGIES: No Known Allergies  HOME MEDICATIONS: Outpatient Medications Prior to Visit  Medication Sig Dispense Refill   betamethasone valerate ointment (VALISONE) 0.1 % Apply 1 application topically 2 (two) times daily. 100 g 0   cholecalciferol (VITAMIN D) 1000 UNITS tablet Take 5,000 Units by mouth daily.     clotrimazole-betamethasone  (LOTRISONE) cream APPLY TO THE AFFECTED AND SURROUNDING AREAS OF SKIN 2 TIMES PER DAY AS NEEDED 45 g 2   fluocinonide gel (LIDEX) 0.27 % Apply 1 application topically 5 (five) times daily. 60 g 5   meloxicam (MOBIC) 15 MG tablet Take 1 tablet (15 mg total) by mouth daily. 30 tablet 0   Multiple Vitamin (MULTIVITAMIN) tablet Take 1 tablet by mouth daily.     Turmeric 500 MG TABS Take by mouth.     levETIRAcetam (KEPPRA XR) 500 MG 24 hr tablet TAKE 2 TABLETS BY MOUTH EVERY MORNING 180 tablet 4   dexamethasone (DECADRON) 0.5 MG/5ML solution Swish 6ms by mouth for 2 minutes 4 times per day. (Patient not taking: Reported on 07/02/2022) 240 mL 1   No facility-administered medications prior to visit.    PAST MEDICAL HISTORY: Past Medical History:  Diagnosis Date   Seizure ([place]     PAST SURGICAL HISTORY: History reviewed. No pertinent surgical history.  FAMILY HISTORY: Family History  Problem Relation Age of Onset   Diabetes Father    Anemia Father    Seizures Neg Hx     SOCIAL HISTORY: Social History   Socioeconomic History   Marital status: Married    Spouse name: Not on file   Number of children: 2   Years of education: Not on file   Highest education level: Not on file  Occupational History    Employer: [employer]  Tobacco Use   Smoking status: Never  Smokeless tobacco: Never  Vaping Use   Vaping Use: Never used  Substance and Sexual Activity   Alcohol use: No   Drug use: No   Sexual activity: Not on file  Other Topics Concern   Not on file  Social History Narrative   Patient is married and has 2 children.    Right-handed   Caffeine: 1 1/2 cups of tea per day   Social Determinants of Health   Financial Resource Strain: Not on file  Food Insecurity: Not on file  Transportation Needs: Not on file  Physical Activity: Not on file  Stress: Not on file  Social Connections: Not on file  Intimate Partner Violence: Not on file   PHYSICAL EXAM  Vitals:    07/02/22 1319  BP: 102/68  Pulse: 76  Weight: 145 lb (65.8 kg)  Height: _0  (1.575 m)   Body mass index is 26.52 kg/m.  Generalized: Well developed, in no acute distress  Neurological examination  Mentation: Alert oriented to time, place, history taking. Follows all commands speech and language fluent Cranial nerve II-XII: Pupils were equal round reactive to light. Extraocular movements were full, visual field were full on confrontational test. Facial sensation and strength were normal.  Head turning and shoulder shrug  were normal and symmetric. Motor: The motor testing reveals 5 over 5 strength of all 4 extremities. Good symmetric motor tone is noted throughout.  Sensory: Sensory testing is intact to soft touch on all 4 extremities. No evidence of extinction is noted.  Coordination: Cerebellar testing reveals good finger-nose-finger and heel-to-shin bilaterally.  Gait and station: Gait is normal.  Reflexes: Deep tendon reflexes are symmetric and normal bilaterally.   DIAGNOSTIC DATA (LABS, IMAGING, TESTING) - I reviewed patient records, labs, notes, testing and imaging myself where available.  Lab Results  Component Value Date   WBC 5.6 04/25/2022   HGB 13.3 04/25/2022   HCT 40.6 04/25/2022   MCV 88.9 04/25/2022   PLT 208.0 04/25/2022      Component Value Date/Time   NA 140 04/25/2022 0958   K 4.0 04/25/2022 0958   CL 103 04/25/2022 0958   CO2 30 04/25/2022 0958   GLUCOSE 94 04/25/2022 0958   BUN 10 04/25/2022 0958   CREATININE 0.76 04/25/2022 0958   CALCIUM 9.5 04/25/2022 0958   PROT 7.9 04/25/2022 0958   ALBUMIN 4.1 04/25/2022 0958   AST 16 04/25/2022 0958   ALT 13 04/25/2022 0958   ALKPHOS 79 04/25/2022 0958   BILITOT 0.5 04/25/2022 0958   GFRNONAA >60 09/21/2010 0630   GFRAA  09/21/2010 0630    >60        The eGFR has been calculated using the MDRD equation. This calculation has not been validated in all clinical situations. eGFR's persistently <60  mL/min signify possible Chronic Kidney Disease.   Lab Results  Component Value Date   CHOL 182 08/16/2021   HDL 52.60 08/16/2021   LDLCALC 115 (H) 08/16/2021   TRIG 70.0 08/16/2021   CHOLHDL 3 08/16/2021   Lab Results  Component Value Date   HGBA1C 6.3 04/25/2022   Lab Results  Component Value Date   IRJJOACZ66 063 04/25/2022   Lab Results  Component Value Date   TSH 3.13 04/25/2022    Butler Denmark, AGNP-C, DNP 07/02/2022, 1:36 PM [facility] [address] [place], [place] [address] [phone]

## 2022-07-02 ENCOUNTER — Encounter: Payer: Self-pay | Admitting: Neurology

## 2022-07-02 ENCOUNTER — Other Ambulatory Visit (HOSPITAL_COMMUNITY): Payer: Self-pay

## 2022-07-02 ENCOUNTER — Ambulatory Visit: Payer: No Typology Code available for payment source | Admitting: Neurology

## 2022-07-02 VITALS — BP 102/68 | HR 76 | Ht 62.0 in | Wt 145.0 lb

## 2022-07-02 DIAGNOSIS — G40309 Generalized idiopathic epilepsy and epileptic syndromes, not intractable, without status epilepticus: Secondary | ICD-10-CM | POA: Diagnosis not present

## 2022-07-02 MED ORDER — LEVETIRACETAM ER 500 MG PO TB24
1000.0000 mg | ORAL_TABLET | Freq: Every morning | ORAL | 4 refills | Status: DC
  Filled 2022-07-02: qty 180, fill #0

## 2022-07-16 ENCOUNTER — Ambulatory Visit: Payer: No Typology Code available for payment source | Admitting: Neurology

## 2022-08-27 ENCOUNTER — Ambulatory Visit (INDEPENDENT_AMBULATORY_CARE_PROVIDER_SITE_OTHER): Payer: 59 | Admitting: Internal Medicine

## 2022-08-27 ENCOUNTER — Other Ambulatory Visit (INDEPENDENT_AMBULATORY_CARE_PROVIDER_SITE_OTHER): Payer: 59

## 2022-08-27 ENCOUNTER — Encounter: Payer: Self-pay | Admitting: Internal Medicine

## 2022-08-27 ENCOUNTER — Other Ambulatory Visit (HOSPITAL_COMMUNITY): Payer: Self-pay

## 2022-08-27 VITALS — BP 100/68 | HR 66 | Temp 98.1°F | Ht 62.0 in | Wt 141.0 lb

## 2022-08-27 DIAGNOSIS — E061 Subacute thyroiditis: Secondary | ICD-10-CM

## 2022-08-27 DIAGNOSIS — Z0000 Encounter for general adult medical examination without abnormal findings: Secondary | ICD-10-CM | POA: Diagnosis not present

## 2022-08-27 DIAGNOSIS — G40309 Generalized idiopathic epilepsy and epileptic syndromes, not intractable, without status epilepticus: Secondary | ICD-10-CM

## 2022-08-27 DIAGNOSIS — R7303 Prediabetes: Secondary | ICD-10-CM | POA: Diagnosis not present

## 2022-08-27 DIAGNOSIS — Z1211 Encounter for screening for malignant neoplasm of colon: Secondary | ICD-10-CM

## 2022-08-27 LAB — HEMOGLOBIN A1C: Hgb A1c MFr Bld: 6.3 % (ref 4.6–6.5)

## 2022-08-27 LAB — LIPID PANEL
Cholesterol: 182 mg/dL (ref 0–200)
HDL: 53.4 mg/dL (ref >39.00)
LDL Cholesterol: 115 mg/dL — ABNORMAL HIGH (ref 0–99)
NonHDL: 128.71
Total CHOL/HDL Ratio: 3
Triglycerides: 67 mg/dL (ref 0.0–149.0)
VLDL: 13.4 mg/dL (ref 0.0–40.0)

## 2022-08-27 LAB — TSH: TSH: 2.27 u[IU]/mL (ref 0.35–5.50)

## 2022-08-27 MED ORDER — LEVETIRACETAM ER 500 MG PO TB24
1000.0000 mg | ORAL_TABLET | Freq: Every morning | ORAL | 4 refills | Status: DC
  Filled 2022-08-27: qty 180, 90d supply, fill #0
  Filled 2022-11-28: qty 180, 90d supply, fill #1
  Filled 2023-02-25: qty 180, 90d supply, fill #2
  Filled 2023-05-26 (×2): qty 180, 90d supply, fill #3
  Filled 2023-08-12: qty 180, 90d supply, fill #4

## 2022-08-27 NOTE — Assessment & Plan Note (Signed)
She wishes [place] to take over prescribing keppra 1000 mg daily and happy to do this. Will check keppra level today. No seizures in some time.

## 2022-08-27 NOTE — Assessment & Plan Note (Signed)
Checking TSH and adjust as needed. She is not on medication.

## 2022-08-27 NOTE — Progress Notes (Signed)
   Subjective:   Patient ID: Lori [name], female    DOB: 1971-02-02, 52 y.o.   MRN: [id number]  HPI The patient is here for physical.  PMH, [facility], social history reviewed and updated  Review of Systems  Constitutional: Negative.   HENT: Negative.    Eyes: Negative.   Respiratory:  Negative for cough, chest tightness and shortness of breath.   Cardiovascular:  Negative for chest pain, palpitations and leg swelling.  Gastrointestinal:  Negative for abdominal distention, abdominal pain, constipation, diarrhea, nausea and vomiting.  Musculoskeletal: Negative.   Skin: Negative.   Neurological: Negative.   Psychiatric/Behavioral: Negative.      Objective:  Physical Exam Constitutional:      Appearance: She is well-developed.  HENT:     Head: Normocephalic and atraumatic.  Cardiovascular:     Rate and Rhythm: Normal rate and regular rhythm.  Pulmonary:     Effort: Pulmonary effort is normal. No respiratory distress.     Breath sounds: Normal breath sounds. No wheezing or rales.  Abdominal:     General: Bowel sounds are normal. There is no distension.     Palpations: Abdomen is soft.     Tenderness: There is no abdominal tenderness. There is no rebound.  Musculoskeletal:     Cervical back: Normal range of motion.  Skin:    General: Skin is warm and dry.  Neurological:     Mental Status: She is alert and oriented to person, place, and time.     Coordination: Coordination normal.     Vitals:   08/27/22 0800  BP: 100/68  Pulse: 66  Temp: 98.1 F (36.7 C)  TempSrc: Oral  SpO2: 97%  Weight: 141 lb (64 kg)  Height: '5\' 2"'$  (1.575 m)    Assessment & Plan:

## 2022-08-27 NOTE — Assessment & Plan Note (Signed)
Checking HgA1c for follow up and adjust as needed.

## 2022-08-27 NOTE — Assessment & Plan Note (Signed)
Flu shot yearly. Covid-19 counseled. Shingrix declines. Tetanus declines. Cologuard ordered. Mammogram with gyn getting records, pap smear with gyn getting records. Counseled about sun safety and mole surveillance. Counseled about the dangers of distracted driving. Given 10 year screening recommendations.

## 2022-08-30 LAB — LEVETIRACETAM LEVEL: Keppra (Levetiracetam): 5.8 ug/mL — ABNORMAL LOW

## 2022-09-09 ENCOUNTER — Ambulatory Visit: Payer: No Typology Code available for payment source | Admitting: Neurology

## 2022-10-22 DIAGNOSIS — Z1211 Encounter for screening for malignant neoplasm of colon: Secondary | ICD-10-CM | POA: Diagnosis not present

## 2022-10-29 LAB — COLOGUARD: COLOGUARD: NEGATIVE

## 2022-10-30 ENCOUNTER — Encounter: Payer: Self-pay | Admitting: Internal Medicine

## 2022-10-30 LAB — COLOGUARD: Cologuard: NEGATIVE

## 2022-11-28 ENCOUNTER — Other Ambulatory Visit (HOSPITAL_COMMUNITY): Payer: Self-pay

## 2022-12-24 ENCOUNTER — Other Ambulatory Visit (HOSPITAL_COMMUNITY): Payer: Self-pay

## 2022-12-24 ENCOUNTER — Ambulatory Visit (INDEPENDENT_AMBULATORY_CARE_PROVIDER_SITE_OTHER): Payer: 59 | Admitting: Internal Medicine

## 2022-12-24 ENCOUNTER — Encounter: Payer: Self-pay | Admitting: Internal Medicine

## 2022-12-24 VITALS — BP 100/64 | HR 79 | Temp 98.4°F | Ht 62.0 in | Wt 141.0 lb

## 2022-12-24 DIAGNOSIS — M791 Myalgia, unspecified site: Secondary | ICD-10-CM

## 2022-12-24 DIAGNOSIS — R700 Elevated erythrocyte sedimentation rate: Secondary | ICD-10-CM | POA: Diagnosis not present

## 2022-12-24 MED ORDER — MELOXICAM 15 MG PO TABS
15.0000 mg | ORAL_TABLET | Freq: Every day | ORAL | 0 refills | Status: AC
  Filled 2022-12-24: qty 30, 30d supply, fill #0

## 2022-12-24 NOTE — Progress Notes (Unsigned)
   Subjective:   Patient ID: Lori [name], female    DOB: June 03, 1971, 52 y.o.   MRN: [id number]  HPI The patient is a 52 YO female coming in for joint pain generalized more local to arms, shoulders and upper legs. Started a few years ago and is episodic. Still has mirena and has had several of those.   Review of Systems  Constitutional:  Positive for activity change. Negative for appetite change, chills, fatigue, fever and unexpected weight change.  Respiratory: Negative.    Cardiovascular: Negative.   Gastrointestinal: Negative.   Musculoskeletal:  Positive for arthralgias, back pain and myalgias. Negative for gait problem and joint swelling.  Skin: Negative.   Neurological: Negative.     Objective:  Physical Exam Constitutional:      Appearance: She is well-developed.  HENT:     Head: Normocephalic and atraumatic.  Cardiovascular:     Rate and Rhythm: Normal rate and regular rhythm.  Pulmonary:     Effort: Pulmonary effort is normal. No respiratory distress.     Breath sounds: Normal breath sounds. No wheezing or rales.  Abdominal:     General: Bowel sounds are normal. There is no distension.     Palpations: Abdomen is soft.     Tenderness: There is no abdominal tenderness. There is no rebound.  Musculoskeletal:        General: Tenderness present.     Cervical back: Normal range of motion.  Skin:    General: Skin is warm and dry.  Neurological:     Mental Status: She is alert and oriented to person, place, and time.     Coordination: Coordination normal.     Vitals:   12/24/22 1549  BP: 100/64  Pulse: 79  Temp: 98.4 F (36.9 C)  TempSrc: Oral  SpO2: 98%  Weight: 141 lb (64 kg)  Height: 5\' 2"  (1.575 m)    Assessment & Plan:  Visit time 20 minutes in face to face communication with patient and coordination of care, additional 10 minutes spent in record review, coordination or care, ordering tests, communicating/referring to other healthcare professionals,  documenting in medical records all on the same day of the visit for total time 30 minutes spent on the visit.

## 2022-12-24 NOTE — Patient Instructions (Signed)
I would recommend to get the mirena removed early as this could be contributing to the pain you are having.  We will check the labs today.  Try taking the meloxicam again for 1-2 weeks to see if this helps.

## 2022-12-25 ENCOUNTER — Encounter: Payer: Self-pay | Admitting: Internal Medicine

## 2022-12-25 ENCOUNTER — Other Ambulatory Visit (HOSPITAL_COMMUNITY): Payer: Self-pay

## 2022-12-25 ENCOUNTER — Other Ambulatory Visit (INDEPENDENT_AMBULATORY_CARE_PROVIDER_SITE_OTHER): Payer: 59

## 2022-12-25 DIAGNOSIS — M791 Myalgia, unspecified site: Secondary | ICD-10-CM

## 2022-12-25 LAB — CBC
HCT: 39.1 % (ref 36.0–46.0)
Hemoglobin: 12.9 g/dL (ref 12.0–15.0)
MCHC: 32.9 g/dL (ref 30.0–36.0)
MCV: 87.3 fl (ref 78.0–100.0)
Platelets: 241 10*3/uL (ref 150.0–400.0)
RBC: 4.48 Mil/uL (ref 3.87–5.11)
RDW: 12.7 % (ref 11.5–15.5)
WBC: 5.3 10*3/uL (ref 4.0–10.5)

## 2022-12-25 LAB — SEDIMENTATION RATE: Sed Rate: 60 mm/hr — ABNORMAL HIGH (ref 0–30)

## 2022-12-25 LAB — CK: Total CK: 129 U/L (ref 7–177)

## 2022-12-25 LAB — C-REACTIVE PROTEIN: CRP: <1 mg/dL (ref 0.5–20.0)

## 2022-12-25 NOTE — Assessment & Plan Note (Signed)
Had reduced at last visit with less joint pain. Checking ESR and CRP today.

## 2022-12-25 NOTE — Assessment & Plan Note (Signed)
There was some concern for PMR due to mildly elevated ESR but rheumatology did not feel this was the case. She did improve from previous flare with meloxicam 15 mg daily. Rx done for this today. Checking ESR, CRP, CK, CBC. She does take turmeric. I have asked her again to get her mirena removed as there is a rare chance this could be causing her pain.

## 2023-02-25 ENCOUNTER — Other Ambulatory Visit (HOSPITAL_COMMUNITY): Payer: Self-pay

## 2023-02-25 ENCOUNTER — Other Ambulatory Visit: Payer: Self-pay

## 2023-02-27 DIAGNOSIS — N912 Amenorrhea, unspecified: Secondary | ICD-10-CM | POA: Diagnosis not present

## 2023-02-27 DIAGNOSIS — Z1231 Encounter for screening mammogram for malignant neoplasm of breast: Secondary | ICD-10-CM | POA: Diagnosis not present

## 2023-02-27 DIAGNOSIS — Z01419 Encounter for gynecological examination (general) (routine) without abnormal findings: Secondary | ICD-10-CM | POA: Diagnosis not present

## 2023-02-27 DIAGNOSIS — Z6826 Body mass index (BMI) 26.0-26.9, adult: Secondary | ICD-10-CM | POA: Diagnosis not present

## 2023-03-19 ENCOUNTER — Encounter: Payer: Self-pay | Admitting: Internal Medicine

## 2023-03-19 ENCOUNTER — Ambulatory Visit: Payer: 59 | Attending: Internal Medicine | Admitting: Internal Medicine

## 2023-03-19 VITALS — BP 100/66 | HR 67 | Resp 12 | Ht 62.5 in | Wt 154.0 lb

## 2023-03-19 DIAGNOSIS — M791 Myalgia, unspecified site: Secondary | ICD-10-CM | POA: Diagnosis not present

## 2023-03-19 DIAGNOSIS — R700 Elevated erythrocyte sedimentation rate: Secondary | ICD-10-CM

## 2023-03-19 DIAGNOSIS — M79642 Pain in left hand: Secondary | ICD-10-CM

## 2023-03-19 DIAGNOSIS — M79641 Pain in right hand: Secondary | ICD-10-CM

## 2023-03-19 LAB — SEDIMENTATION RATE: Sed Rate: 36 mm/h — ABNORMAL HIGH (ref 0–30)

## 2023-03-19 NOTE — Progress Notes (Signed)
Office Visit Note  Patient: Lori [name]             Date of Birth: 13-Nov-1970           MRN: [id number]             PCP: Myrlene Broker, MD Referring: Myrlene Broker, * Visit Date: 03/19/2023   Subjective:  Follow-up (Patient states she has constant pain in her shoulders, elbows, wrists, hands, and fingers. Patient states she has upper thigh pain and knee pain sometimes. Patient states her right side hurts worse than her left. Patient states she has occasional feet pain. Patient states it is hard to use her hands and some joints in her fingers feel like they are burning. )   History of Present Illness: Lori [name] is a 52 y.o. female here for follow up for ongoing intermittent joint pain in multiple areas open arms and legs more severe on the right half of her body.  We saw her over 2 years ago since that time has been using oral NSAIDs, meloxicam intermittently with good symptom improvement while taking the medicine and usually last up to a few months afterwards before joint pain comes back in multiple areas.  Recently she is having trouble due to stomach irritation when taking the meloxicam.  Also with some new symptoms describes a burning sensation type of pain in her fingers.  Not associated with visible swelling or discoloration.  Has had persistently elevated sedimentation rate on multiple test throughout the past year and this was trending upward again with increase to 60 in May.   Previous HPI 07/26/20 [name] is a 52 y.o. female here for evaluation of elevated sedimentation rate checked in association with fatigue and bilateral shoulder pain. These symptoms started around October with R>L shoulder pain that was continuous but worsened with use and when lying on a side at night. Before this she generally did not experience daily pain. She had a previous episode of joint pain last fall with almost a year between issues. She started taking meloxicam as  recommended and noticed about a 90% improvement in shoulder pain with 2 weeks of this medication. She does still have fatigue.    Review of Systems  Constitutional:  Positive for fatigue.  HENT:  Negative for mouth sores and mouth dryness.   Eyes:  Negative for dryness.  Respiratory:  Negative for shortness of breath.   Cardiovascular:  Negative for chest pain and palpitations.  Gastrointestinal:  Negative for blood in stool, constipation and diarrhea.  Endocrine: Negative for increased urination.  Genitourinary:  Negative for involuntary urination.  Musculoskeletal:  Positive for joint pain, joint pain, myalgias, muscle weakness, morning stiffness and myalgias. Negative for gait problem, joint swelling and muscle tenderness.  Skin:  Negative for color change, rash, hair loss and sensitivity to sunlight.  Allergic/Immunologic: Negative for susceptible to infections.  Neurological:  Negative for dizziness and headaches.  Hematological:  Negative for swollen glands.  Psychiatric/Behavioral:  Negative for depressed mood and sleep disturbance. The patient is not nervous/anxious.     PMFS History:  Patient Active Problem List   Diagnosis Date Noted   Chronic pain of left thumb 04/26/2022   Lichen planus 02/07/2022   Elevated sedimentation rate 07/26/2020   Fatigue 06/16/2020   Bilateral shoulder pain 06/16/2020   Pre-diabetes 11/26/2019   Bilateral hand pain 07/06/2019   Vitamin D deficiency 07/06/2019   Headache syndrome 06/17/2017   Allergic rhinitis 11/01/2016  Subacute thyroiditis 06/13/2015   Back pain 04/27/2015   Myalgia 04/25/2015   Routine general medical examination at a health care facility 07/28/2014   Seizure disorder, primary generalized (HCC) 03/19/2013    Past Medical History:  Diagnosis Date   Seizure (HCC)     Family History  Problem Relation Age of Onset   Breast cancer Mother    Diabetes Father    Anemia Father    Seizures Neg Hx    History reviewed.  No pertinent surgical history. Social History   Social History Narrative   Patient is married and has 2 children.    Right-handed   Caffeine: 1 1/2 cups of tea per day   Immunization History  Administered Date(s) Administered   Influenza Whole 05/12/2014   Influenza,inj,Quad PF,6+ Mos 05/13/2015, 05/27/2016, 04/14/2019, 05/12/2021   Influenza-Unspecified 05/13/2017, 05/13/2018, 05/29/2020, 05/12/2022   PFIZER(Purple Top)SARS-COV-2 Vaccination 08/14/2019, 09/02/2019   Tdap 04/25/2015     Objective: Vital Signs: BP 100/66 (BP Location: Left Arm, Patient Position: Sitting, Cuff Size: Normal)   Pulse 67   Resp 12   Ht 5' 2.5" (1.588 m)   Wt 154 lb (69.9 kg)   BMI 27.72 kg/m    Physical Exam Eyes:     Conjunctiva/sclera: Conjunctivae normal.  Cardiovascular:     Rate and Rhythm: Normal rate and regular rhythm.  Pulmonary:     Effort: Pulmonary effort is normal.     Breath sounds: Normal breath sounds.  Musculoskeletal:     Right lower leg: No edema.     Left lower leg: No edema.  Skin:    General: Skin is warm and dry.     Findings: No rash.  Neurological:     Mental Status: She is alert.  Psychiatric:        Mood and Affect: Mood normal.      Musculoskeletal Exam:  Neck full ROM no tenderness Shoulders full ROM no tenderness or swelling Elbows full ROM no tenderness or swelling Wrists full ROM no tenderness or swelling Fingers full ROM no tenderness or swelling Knees full ROM no tenderness or swelling Ankles full ROM no tenderness or swelling     Investigation: No additional findings.  Imaging: No results found.  Recent Labs: Lab Results  Component Value Date   WBC 5.3 12/25/2022   HGB 12.9 12/25/2022   PLT 241.0 12/25/2022   NA 140 04/25/2022   K 4.0 04/25/2022   CL 103 04/25/2022   CO2 30 04/25/2022   GLUCOSE 94 04/25/2022   BUN 10 04/25/2022   CREATININE 0.76 04/25/2022   BILITOT 0.5 04/25/2022   ALKPHOS 79 04/25/2022   AST 16 04/25/2022    ALT 13 04/25/2022   PROT 7.9 04/25/2022   ALBUMIN 4.1 04/25/2022   CALCIUM 9.5 04/25/2022   GFRAA  09/21/2010    >60        The eGFR has been calculated using the MDRD equation. This calculation has not been validated in all clinical situations. eGFR's persistently <60 mL/min signify possible Chronic Kidney Disease.   QFTBGOLDPLUS NEGATIVE 09/06/2020    Speciality Comments: No specialty comments available.  Procedures:  No procedures performed Allergies: Patient has no known allergies.   Assessment / Plan:     Visit Diagnoses: Bilateral hand pain  Myalgia - Plan: C3 and C4, Cyclic citrul peptide antibody, IgG  Cannot entirely exclude seronegative RA or palindromic rheumatism as symptoms are intermittent and is not in severe flareup today but not clinically apparent on exam today.  Checking CCP antibody and serum complements as well.  If results remain nonspecific would just continue symptomatic management for now.  Discussed possibly trying Celebrex as alternative for meloxicam for less GI toxicity if needing repeat use.  Elevated sedimentation rate - Plan: Sedimentation rate  Previous elevated sedimentation rate not entirely clear underlying process will recheck this today.  She does not seem to be in as severe symptom exacerbation right now so may be decreased without any specific intervention so far.  Orders: Orders Placed This Encounter  Procedures   C3 and C4   Sedimentation rate   Cyclic citrul peptide antibody, IgG   No orders of the defined types were placed in this encounter.    Follow-Up Instructions: Return in about 3 months (around 06/19/2023), or if symptoms worsen or fail to improve.   Fuller Plan, MD  Note - This record has been created using [employer].  Chart creation errors have been sought, but may not always  have been located. Such creation errors do not reflect on  the standard of medical care.

## 2023-05-06 NOTE — Progress Notes (Unsigned)
[facility] [address] [place] [address] Phone: [phone] Subjective:   Bruce Donath, am serving as a scribe for Dr. Antoine Primas.  I'm seeing this patient by the request  of:  Myrlene Broker, MD  CC: Shoulder pain  [id number]  Lori [name] is a 52 y.o. female coming in with complaint of shoulder pain. Last seen in 2020 for hand pain. Patient states that she was taking meloxicam which was helpful to alleviate her overall joint pain over the past 2 years.   Pain with empty can and flexion within the joint. Pain radiates into the R elbow down into the forearm. Hx of dislocation of R shoulder. Painful to scrub or knead with the R hand.   Also has pain in all the joints in her hands. L hand joints also painful constantly. Painful to hit joints on surfaces. Pain can be burning in nature.   Complains of chronic fatigue as well.   Reviewed notes from rheumatology who did discuss their desire for her to potentially switch to Celebrex.  Patient also got repeat labs in 4 months ago had a elevation in the sedimentation rate of 60 and now down to 36       Past Medical History:  Diagnosis Date   Seizure (HCC)    No past surgical history on file. Social History   Socioeconomic History   Marital status: Married    Spouse name: Not on file   Number of children: 2   Years of education: Not on file   Highest education level: Not on file  Occupational History    Employer: [employer]  Tobacco Use   Smoking status: Never    Passive exposure: Never   Smokeless tobacco: Never  Vaping Use   Vaping status: Never Used  Substance and Sexual Activity   Alcohol use: No   Drug use: No   Sexual activity: Not on file  Other Topics Concern   Not on file  Social History Narrative   Patient is married and has 2 children.    Right-handed   Caffeine: 1 1/2 cups of tea per day   Social Determinants of Health   Financial Resource  Strain: Not on file  Food Insecurity: Not on file  Transportation Needs: Not on file  Physical Activity: Not on file  Stress: Not on file  Social Connections: Not on file   No Known Allergies Family History  Problem Relation Age of Onset   Breast cancer Mother    Diabetes Father    Anemia Father    Seizures Neg Hx      Current Outpatient Medications (Cardiovascular):    nitroGLYCERIN (NITRO-DUR) 0.2 mg/hr patch, Apply 1/4 of a patch to skin once daily.   Current Outpatient Medications (Analgesics):    meloxicam (MOBIC) 15 MG tablet, Take 1 tablet (15 mg total) by mouth daily.   Current Outpatient Medications (Other):    cholecalciferol (VITAMIN D) 1000 UNITS tablet, Take 5,000 Units by mouth daily.   fluocinonide gel (LIDEX) 0.05 %, Apply 1 application topically 5 (five) times daily.   levETIRAcetam (KEPPRA XR) 500 MG 24 hr tablet, Take 2 tablets (1,000 mg total) by mouth every morning.   Turmeric 500 MG TABS, Take by mouth.   Reviewed prior external information including notes and imaging from  primary care provider As well as notes that were available from care everywhere and other healthcare systems.  Past medical history, social, surgical and  family history all reviewed in electronic medical record.  No pertanent information unless stated regarding to the chief complaint.   Review of Systems:  No headache, visual changes, nausea, vomiting, diarrhea, constipation, dizziness, abdominal pain, skin rash, fevers, chills, night sweats, weight loss, swollen lymph nodes, body aches, joint swelling, chest pain, shortness of breath, mood changes. POSITIVE muscle aches  Objective  Blood pressure 100/68, pulse 65, height 5' 2.5" (1.588 m), weight 143 lb (64.9 kg).   General: No apparent distress alert and oriented x3 mood and affect normal, dressed appropriately.  HEENT: Pupils equal, extraocular movements intact  Respiratory: Patient's speak in full sentences and does not  appear short of breath  Cardiovascular: No lower extremity edema, non tender, no erythema  Shoulder exam shows right shoulder does have some tenderness on exam.  Positive impingement noted.  Rotator cuff strength 4+ out of 5.  Positive O'Brien's positive crossover noted.  Limited muscular skeletal ultrasound was performed and interpreted by Antoine Primas, M   Limited ultrasound shows patient does have a hypoechoic changes of the supraspinatus that is consistent with a questionable partial tear noted.  Seems to be potentially acute on chronic.  Does have some mild atrophy noted.  Does have a Hill-Sachs deformity noted.  Does have some ascitic deposits noted of the posterior labrum.    Impression and Recommendations:    The above documentation has been reviewed and is accurate and complete Judi Saa, DO

## 2023-05-07 ENCOUNTER — Other Ambulatory Visit (HOSPITAL_COMMUNITY): Payer: Self-pay

## 2023-05-07 ENCOUNTER — Other Ambulatory Visit: Payer: Self-pay

## 2023-05-07 ENCOUNTER — Ambulatory Visit (INDEPENDENT_AMBULATORY_CARE_PROVIDER_SITE_OTHER): Payer: 59

## 2023-05-07 ENCOUNTER — Encounter: Payer: Self-pay | Admitting: Family Medicine

## 2023-05-07 ENCOUNTER — Ambulatory Visit: Payer: 59 | Admitting: Family Medicine

## 2023-05-07 VITALS — BP 100/68 | HR 65 | Ht 62.5 in | Wt 143.0 lb

## 2023-05-07 DIAGNOSIS — M25511 Pain in right shoulder: Secondary | ICD-10-CM

## 2023-05-07 DIAGNOSIS — M50222 Other cervical disc displacement at C5-C6 level: Secondary | ICD-10-CM | POA: Diagnosis not present

## 2023-05-07 DIAGNOSIS — M75111 Incomplete rotator cuff tear or rupture of right shoulder, not specified as traumatic: Secondary | ICD-10-CM | POA: Diagnosis not present

## 2023-05-07 MED ORDER — NITROGLYCERIN 0.2 MG/HR TD PT24
MEDICATED_PATCH | TRANSDERMAL | 0 refills | Status: AC
  Filled 2023-05-07: qty 30, 90d supply, fill #0

## 2023-05-07 NOTE — Assessment & Plan Note (Addendum)
Patient on ultrasound does have what appears to be a partial tear of the supraspinatus which does correspond with patient's symptoms.  In addition to this though patient also had the post dislocation findings with a Hill-Sachs deformity.  X-rays are ordered today as well.  Started on nitroglycerin patches and warned of potential side effects.  Patient has had low blood pressure and will monitor.  We discussed with patient about icing regimen, work with Event organiser to learn home exercises.  Worsening pain consider injection and formal physical therapy.  If any increasing weakness we may have to consider the possibility of advance imaging

## 2023-05-07 NOTE — Patient Instructions (Addendum)
Xray today Do prescribed exercises at least 3x a week Prescription sent See you again in 5-6 weeks

## 2023-05-26 ENCOUNTER — Other Ambulatory Visit (HOSPITAL_COMMUNITY): Payer: Self-pay

## 2023-05-27 ENCOUNTER — Other Ambulatory Visit (HOSPITAL_COMMUNITY): Payer: Self-pay

## 2023-06-12 ENCOUNTER — Encounter: Payer: Self-pay | Admitting: Family Medicine

## 2023-06-12 ENCOUNTER — Other Ambulatory Visit: Payer: Self-pay

## 2023-06-12 ENCOUNTER — Ambulatory Visit (INDEPENDENT_AMBULATORY_CARE_PROVIDER_SITE_OTHER): Payer: 59 | Admitting: Family Medicine

## 2023-06-12 ENCOUNTER — Other Ambulatory Visit (HOSPITAL_COMMUNITY): Payer: Self-pay

## 2023-06-12 VITALS — BP 112/78 | HR 76 | Temp 97.6°F | Ht 62.5 in | Wt 144.0 lb

## 2023-06-12 DIAGNOSIS — J302 Other seasonal allergic rhinitis: Secondary | ICD-10-CM

## 2023-06-12 DIAGNOSIS — R058 Other specified cough: Secondary | ICD-10-CM

## 2023-06-12 MED ORDER — LORATADINE 10 MG PO TABS
10.0000 mg | ORAL_TABLET | Freq: Every day | ORAL | 1 refills | Status: AC
Start: 2023-06-12 — End: ?
  Filled 2023-06-12 (×2): qty 30, 30d supply, fill #0

## 2023-06-12 MED ORDER — AZITHROMYCIN 250 MG PO TABS
ORAL_TABLET | ORAL | 0 refills | Status: AC
Start: 2023-06-12 — End: 2023-06-17
  Filled 2023-06-12 (×2): qty 6, 5d supply, fill #0

## 2023-06-12 NOTE — Progress Notes (Signed)
Subjective:  Lori [name] is a 52 y.o. female who presents for a 3-4 wk hx URI symptoms.   C/o fatigue, body aches, nasal congestion, cough with clear mucus. Wheezing.   Taking OTC Tylenol cold medications, and Mucinex.   Denies fever, chills, dizziness, headache, ear pain, sinus pain, ST, chest pain, shortness of breath, abdominal pain, nausea, vomiting or diarrhea.   ROS as in subjective.   Objective: Vitals:   06/12/23 0836  BP: 112/78  Pulse: 76  Temp: 97.6 F (36.4 C)  SpO2: 98%    General appearance: Alert, WD/WN, no distress                             Skin: warm, no rash                           Head: no sinus tenderness                            Eyes: conjunctiva normal, corneas clear, PERRLA                            Ears: pearly TMs, external ear canals normal                          Nose: septum midline, turbinates swollen, no discharge              Mouth/throat: MMM, tongue normal, mild pharyngeal erythema                           Neck: supple, no adenopathy, no thyromegaly, nontender                          Heart: RRR                         Lungs: CTA bilaterally, no wheezes, rales, or rhonchi      Assessment: Cough present for greater than 3 weeks - Plan: azithromycin (ZITHROMAX) 250 MG tablet  Seasonal allergic rhinitis, unspecified trigger - Plan: loratadine (CLARITIN) 10 MG tablet   Plan: Azithromycin prescribed due to course of illness. Suggested symptomatic OTC remedies.  Advised to start Claritin. Nasal saline spray or Flonase for congestion.  Tylenol or Ibuprofen OTC as needed.  Call/return in 3-4 days if not improving.

## 2023-06-12 NOTE — Patient Instructions (Signed)
Take the antibiotic as prescribed.  Take Claritin daily.  Use Flonase as needed for nasal congestion and runny nose.  Drink plenty of water.  Follow-up if you are not improving in the next 3 to 4 days or if you are getting worse

## 2023-07-02 ENCOUNTER — Telehealth: Payer: Self-pay | Admitting: Neurology

## 2023-08-04 NOTE — Progress Notes (Unsigned)
[name] [address] [place] [address] Phone: [phone] Subjective:   Lori [name], am serving as a scribe for Dr. Antoine Primas.  I'm seeing this patient by the request  of:  Myrlene Broker, MD  CC: Right shoulder pain follow-up  [id number]  Lori [name] is a 52 y.o. female coming in with complaint of R shoulder pain. Exercising helps, but when doing certain activities around the house doing moderate pressure makes it hurt. When working on computer shoulder into neck starts to hurt. Weakness and pain with gripping.     Past Medical History:  Diagnosis Date   Seizure (HCC)    No past surgical history on file. Social History   Socioeconomic History   Marital status: Married    Spouse name: Not on file   Number of children: 2   Years of education: Not on file   Highest education level: Not on file  Occupational History    Employer: [employer]  Tobacco Use   Smoking status: Never    Passive exposure: Never   Smokeless tobacco: Never  Vaping Use   Vaping status: Never Used  Substance and Sexual Activity   Alcohol use: No   Drug use: No   Sexual activity: Not on file  Other Topics Concern   Not on file  Social History Narrative   Patient is married and has 2 children.    Right-handed   Caffeine: 1 1/2 cups of tea per day   Social Drivers of Corporate investment banker Strain: Not on file  Food Insecurity: Not on file  Transportation Needs: Not on file  Physical Activity: Not on file  Stress: Not on file  Social Connections: Not on file   No Known Allergies Family History  Problem Relation Age of Onset   Breast cancer Mother    Diabetes Father    Anemia Father    Seizures Neg Hx      Current Outpatient Medications (Cardiovascular):    nitroGLYCERIN (NITRO-DUR) 0.2 mg/hr patch, Apply 1/4 of a patch to skin once daily. (Patient not taking: Reported on 06/12/2023)  Current Outpatient Medications  (Respiratory):    loratadine (CLARITIN) 10 MG tablet, Take 1 tablet (10 mg total) by mouth daily.  Current Outpatient Medications (Analgesics):    meloxicam (MOBIC) 15 MG tablet, Take 1 tablet (15 mg total) by mouth daily. (Patient not taking: Reported on 06/12/2023)   Current Outpatient Medications (Other):    cholecalciferol (VITAMIN D) 1000 UNITS tablet, Take 5,000 Units by mouth daily.   fluocinonide gel (LIDEX) 0.05 %, Apply 1 application topically 5 (five) times daily.   levETIRAcetam (KEPPRA XR) 500 MG 24 hr tablet, Take 2 tablets (1,000 mg total) by mouth every morning.   Turmeric 500 MG TABS, Take by mouth.   Reviewed prior external information including notes and imaging from  primary care provider As well as notes that were available from care everywhere and other healthcare systems.  Past medical history, social, surgical and family history all reviewed in electronic medical record.  No pertanent information unless stated regarding to the chief complaint.   Review of Systems:  No headache, visual changes, nausea, vomiting, diarrhea, constipation, dizziness, abdominal pain, skin rash, fevers, chills, night sweats, weight loss, swollen lymph nodes, body aches, joint swelling, chest pain, shortness of breath, mood changes. POSITIVE muscle aches  Objective  There were no vitals taken for this visit.   General: No apparent distress alert  and oriented x3 mood and affect normal, dressed appropriately.  HEENT: Pupils equal, extraocular movements intact  Respiratory: Patient's speak in full sentences and does not appear short of breath  Cardiovascular: No lower extremity edema, non tender, no erythema  Right shoulder exam shows the patient has some positive impingement noted.  Patient's rotator cuff strength seems to be improved.  Rotator cuff strength seems to be better.  Does have some pain in the posterior aspect of the shoulder.  Limited muscular skeletal ultrasound was  performed and interpreted by Antoine Primas, M   Limited ultrasound shows patient still has some mild hypoechoic changes of the glenohumeral joint on the posterior aspect.  Patient's rotator cuff though appears to have significant improvement from previous exam. Impression: Still some small effusion noted.  Procedure: Real-time Ultrasound Guided Injection of right glenohumeral joint Device: GE Logiq Q7  Ultrasound guided injection is preferred based studies that show increased duration, increased effect, greater accuracy, decreased procedural pain, increased response rate with ultrasound guided versus blind injection.  Verbal informed consent obtained.  Time-out conducted.  Noted no overlying erythema, induration, or other signs of local infection.  Skin prepped in a sterile fashion.  Local anesthesia: Topical Ethyl chloride.  With sterile technique and under real time ultrasound guidance:  Joint visualized.  23g 1  inch needle inserted posterior approach. Pictures taken for needle placement. Patient did have injection of 2 cc of 1% lidocaine, 2 cc of 0.5% Marcaine, and 1.0 cc of Kenalog 40 mg/dL. Completed without difficulty  Pain immediately resolved suggesting accurate placement of the medication.  Advised to call if fevers/chills, erythema, induration, drainage, or persistent bleeding.  Impression: Technically successful ultrasound guided injection.     Impression and Recommendations:    The above documentation has been reviewed and is accurate and complete Judi Saa, DO

## 2023-08-05 ENCOUNTER — Ambulatory Visit: Payer: 59 | Admitting: Family Medicine

## 2023-08-05 ENCOUNTER — Other Ambulatory Visit: Payer: Self-pay

## 2023-08-05 ENCOUNTER — Encounter: Payer: Self-pay | Admitting: Family Medicine

## 2023-08-05 VITALS — BP 110/74 | HR 67 | Ht 62.0 in | Wt 147.0 lb

## 2023-08-05 DIAGNOSIS — M75111 Incomplete rotator cuff tear or rupture of right shoulder, not specified as traumatic: Secondary | ICD-10-CM

## 2023-08-05 DIAGNOSIS — M25511 Pain in right shoulder: Secondary | ICD-10-CM | POA: Diagnosis not present

## 2023-08-05 NOTE — Assessment & Plan Note (Signed)
Injection given today and tolerated the procedure well, discussed icing regimen and home exercises, discussed avoiding certain activities.  Discussed icing regimen.  Do believe patient will do well.  Given some exercises for the neck also noted.  Follow-up with me again in 6 to 8 weeks.

## 2023-08-05 NOTE — Patient Instructions (Addendum)
Vertical mouse Write [place] in 2 weeks to let us know how it's going Injection in shoulder today Do prescribed exercises at least 3x a week See you again in 2-3 months

## 2023-08-12 ENCOUNTER — Other Ambulatory Visit (HOSPITAL_COMMUNITY): Payer: Self-pay

## 2023-08-12 ENCOUNTER — Other Ambulatory Visit: Payer: Self-pay

## 2023-08-25 ENCOUNTER — Encounter: Payer: 59 | Admitting: Internal Medicine

## 2023-08-26 ENCOUNTER — Encounter: Payer: 59 | Admitting: Internal Medicine

## 2023-11-04 ENCOUNTER — Other Ambulatory Visit: Payer: Self-pay

## 2023-11-04 ENCOUNTER — Encounter: Payer: Self-pay | Admitting: Internal Medicine

## 2023-11-04 ENCOUNTER — Other Ambulatory Visit (HOSPITAL_COMMUNITY): Payer: Self-pay

## 2023-11-04 ENCOUNTER — Other Ambulatory Visit (INDEPENDENT_AMBULATORY_CARE_PROVIDER_SITE_OTHER)

## 2023-11-04 ENCOUNTER — Ambulatory Visit (INDEPENDENT_AMBULATORY_CARE_PROVIDER_SITE_OTHER): Admitting: Internal Medicine

## 2023-11-04 VITALS — BP 112/80 | HR 65 | Temp 98.2°F | Ht 62.0 in | Wt 144.0 lb

## 2023-11-04 DIAGNOSIS — R7303 Prediabetes: Secondary | ICD-10-CM | POA: Diagnosis not present

## 2023-11-04 DIAGNOSIS — G40309 Generalized idiopathic epilepsy and epileptic syndromes, not intractable, without status epilepticus: Secondary | ICD-10-CM

## 2023-11-04 DIAGNOSIS — E559 Vitamin D deficiency, unspecified: Secondary | ICD-10-CM

## 2023-11-04 DIAGNOSIS — Z0000 Encounter for general adult medical examination without abnormal findings: Secondary | ICD-10-CM | POA: Diagnosis not present

## 2023-11-04 DIAGNOSIS — E061 Subacute thyroiditis: Secondary | ICD-10-CM

## 2023-11-04 LAB — LIPID PANEL
Cholesterol: 187 mg/dL (ref 0–200)
HDL: 54.5 mg/dL (ref >39.00)
LDL Cholesterol: 120 mg/dL — ABNORMAL HIGH (ref 0–99)
NonHDL: 132.37
Total CHOL/HDL Ratio: 3
Triglycerides: 64 mg/dL (ref 0.0–149.0)
VLDL: 12.8 mg/dL (ref 0.0–40.0)

## 2023-11-04 LAB — VITAMIN D 25 HYDROXY (VIT D DEFICIENCY, FRACTURES): VITD: 28.96 ng/mL — ABNORMAL LOW (ref 30.00–100.00)

## 2023-11-04 LAB — COMPREHENSIVE METABOLIC PANEL
ALT: 10 U/L (ref 0–35)
AST: 15 U/L (ref 0–37)
Albumin: 4.2 g/dL (ref 3.5–5.2)
Alkaline Phosphatase: 80 U/L (ref 39–117)
BUN: 14 mg/dL (ref 6–23)
CO2: 27 meq/L (ref 19–32)
Calcium: 9.3 mg/dL (ref 8.4–10.5)
Chloride: 106 meq/L (ref 96–112)
Creatinine, Ser: 0.68 mg/dL (ref 0.40–1.20)
GFR: 99.48 mL/min (ref >60.00)
Glucose, Bld: 106 mg/dL — ABNORMAL HIGH (ref 70–99)
Potassium: 4 meq/L (ref 3.5–5.1)
Sodium: 141 meq/L (ref 135–145)
Total Bilirubin: 0.5 mg/dL (ref 0.2–1.2)
Total Protein: 7.1 g/dL (ref 6.0–8.3)

## 2023-11-04 LAB — CBC
HCT: 39.7 % (ref 36.0–46.0)
Hemoglobin: 12.9 g/dL (ref 12.0–15.0)
MCHC: 32.5 g/dL (ref 30.0–36.0)
MCV: 89.3 fl (ref 78.0–100.0)
Platelets: 242 10*3/uL (ref 150.0–400.0)
RBC: 4.44 Mil/uL (ref 3.87–5.11)
RDW: 13 % (ref 11.5–15.5)
WBC: 4.9 10*3/uL (ref 4.0–10.5)

## 2023-11-04 LAB — TSH: TSH: 2.44 u[IU]/mL (ref 0.35–5.50)

## 2023-11-04 LAB — HEMOGLOBIN A1C: Hgb A1c MFr Bld: 6.4 % (ref 4.6–6.5)

## 2023-11-04 MED ORDER — LEVETIRACETAM ER 500 MG PO TB24
1000.0000 mg | ORAL_TABLET | Freq: Every morning | ORAL | 3 refills | Status: AC
  Filled 2023-11-04: qty 180, 90d supply, fill #0
  Filled 2024-02-23: qty 180, 90d supply, fill #1
  Filled 2024-06-01: qty 180, 90d supply, fill #2
  Filled 2024-09-03: qty 180, 90d supply, fill #3

## 2023-11-04 MED ORDER — FLUOCINONIDE 0.05 % EX GEL
1.0000 | Freq: Every day | CUTANEOUS | 5 refills | Status: AC
  Filled 2023-11-04: qty 60, 12d supply, fill #0

## 2023-11-04 NOTE — Progress Notes (Signed)
   Subjective:   Patient ID: Lori [name], female    DOB: 08-14-1970, 53 y.o.   MRN: [id number]  HPI The patient is here for physical.  PMH, [facility], social history reviewed and updated  Review of Systems  Constitutional: Negative.   HENT: Negative.    Eyes: Negative.   Respiratory:  Negative for cough, chest tightness and shortness of breath.   Cardiovascular:  Negative for chest pain, palpitations and leg swelling.  Gastrointestinal:  Negative for abdominal distention, abdominal pain, constipation, diarrhea, nausea and vomiting.  Musculoskeletal: Negative.   Skin: Negative.   Neurological: Negative.   Psychiatric/Behavioral: Negative.      Objective:  Physical Exam Constitutional:      Appearance: She is well-developed.  HENT:     Head: Normocephalic and atraumatic.  Cardiovascular:     Rate and Rhythm: Normal rate and regular rhythm.  Pulmonary:     Effort: Pulmonary effort is normal. No respiratory distress.     Breath sounds: Normal breath sounds. No wheezing or rales.  Abdominal:     General: Bowel sounds are normal. There is no distension.     Palpations: Abdomen is soft.     Tenderness: There is no abdominal tenderness. There is no rebound.  Musculoskeletal:     Cervical back: Normal range of motion.  Skin:    General: Skin is warm and dry.  Neurological:     Mental Status: She is alert and oriented to person, place, and time.     Coordination: Coordination normal.     Vitals:   11/04/23 0802  BP: 112/80  Pulse: 65  Temp: 98.2 F (36.8 C)  TempSrc: Oral  SpO2: 99%  Weight: 144 lb (65.3 kg)  Height: 5\' 2"  (1.575 m)    Assessment & Plan:

## 2023-11-04 NOTE — Assessment & Plan Note (Signed)
 Checking vitamin D and adjust as needed.

## 2023-11-04 NOTE — Assessment & Plan Note (Signed)
Checking TSH and adjust as needed.  

## 2023-11-04 NOTE — Assessment & Plan Note (Signed)
 No seizures, continue keppra 1000 mg daily. Refilled and checking levels today.

## 2023-11-04 NOTE — Patient Instructions (Signed)
 We will check the labs today.

## 2023-11-04 NOTE — Assessment & Plan Note (Signed)
Flu shot yearly. Shingrix counseled. Tetanus up to date. Colonoscopy up to date. Mammogram up to date, pap smear up to date. Counseled about sun safety and mole surveillance. Counseled about the dangers of distracted driving. Given 10 year screening recommendations.   

## 2023-11-04 NOTE — Assessment & Plan Note (Signed)
 Checking Hga1c and adjust as needed.

## 2023-11-05 ENCOUNTER — Other Ambulatory Visit (HOSPITAL_COMMUNITY): Payer: Self-pay

## 2023-11-05 ENCOUNTER — Other Ambulatory Visit: Payer: Self-pay

## 2023-11-05 LAB — LEVETIRACETAM LEVEL: Levetiracetam Lvl: 25.1 ug/mL (ref 10.0–40.0)

## 2023-11-06 ENCOUNTER — Other Ambulatory Visit (HOSPITAL_COMMUNITY): Payer: Self-pay

## 2023-11-13 ENCOUNTER — Encounter: Payer: Self-pay | Admitting: Internal Medicine

## 2023-11-13 ENCOUNTER — Telehealth: Payer: Self-pay | Admitting: Internal Medicine

## 2023-11-13 NOTE — Telephone Encounter (Signed)
 Copied from CRM [id number]. Topic: General - Billing Inquiry >> Nov 13, 2023  3:32 PM [place] A wrote: Reason for CRM: Patient called said that office put wrong code in for labs and her insurance did not cover it-please contact

## 2023-11-17 ENCOUNTER — Other Ambulatory Visit (HOSPITAL_COMMUNITY): Payer: Self-pay

## 2024-02-23 ENCOUNTER — Other Ambulatory Visit (HOSPITAL_COMMUNITY): Payer: Self-pay

## 2024-03-01 ENCOUNTER — Other Ambulatory Visit (HOSPITAL_COMMUNITY): Payer: Self-pay

## 2024-03-01 DIAGNOSIS — Z1231 Encounter for screening mammogram for malignant neoplasm of breast: Secondary | ICD-10-CM | POA: Diagnosis not present

## 2024-03-01 DIAGNOSIS — Z01419 Encounter for gynecological examination (general) (routine) without abnormal findings: Secondary | ICD-10-CM | POA: Diagnosis not present

## 2024-03-01 DIAGNOSIS — Z6826 Body mass index (BMI) 26.0-26.9, adult: Secondary | ICD-10-CM | POA: Diagnosis not present

## 2024-03-01 DIAGNOSIS — Z30432 Encounter for removal of intrauterine contraceptive device: Secondary | ICD-10-CM | POA: Diagnosis not present

## 2024-03-01 MED ORDER — ESTRADIOL 0.1 MG/24HR TD PTTW
1.0000 | MEDICATED_PATCH | TRANSDERMAL | 12 refills | Status: AC
  Filled 2024-03-01: qty 8, 28d supply, fill #0
  Filled 2024-03-26: qty 8, 28d supply, fill #1
  Filled 2024-04-28: qty 8, 28d supply, fill #2
  Filled 2024-06-01: qty 8, 28d supply, fill #3
  Filled 2024-07-01: qty 8, 28d supply, fill #4
  Filled 2024-07-30: qty 8, 28d supply, fill #5

## 2024-03-01 MED ORDER — PROGESTERONE MICRONIZED 100 MG PO CAPS
100.0000 mg | ORAL_CAPSULE | Freq: Every day | ORAL | 12 refills | Status: AC
  Filled 2024-03-01: qty 30, 30d supply, fill #0
  Filled 2024-03-26: qty 30, 30d supply, fill #1
  Filled 2024-04-28: qty 20, 20d supply, fill #2
  Filled 2024-04-28: qty 11, 11d supply, fill #2
  Filled 2024-04-28: qty 10, 10d supply, fill #2
  Filled 2024-06-01: qty 30, 30d supply, fill #3
  Filled 2024-07-01: qty 30, 30d supply, fill #4
  Filled 2024-07-30: qty 30, 30d supply, fill #5
  Filled 2024-08-30: qty 30, 30d supply, fill #6

## 2024-03-26 ENCOUNTER — Other Ambulatory Visit (HOSPITAL_COMMUNITY): Payer: Self-pay

## 2024-04-28 ENCOUNTER — Other Ambulatory Visit (HOSPITAL_COMMUNITY): Payer: Self-pay

## 2024-05-06 DIAGNOSIS — H5203 Hypermetropia, bilateral: Secondary | ICD-10-CM | POA: Diagnosis not present

## 2024-06-01 ENCOUNTER — Other Ambulatory Visit (HOSPITAL_COMMUNITY): Payer: Self-pay

## 2024-07-01 ENCOUNTER — Other Ambulatory Visit (HOSPITAL_COMMUNITY): Payer: Self-pay

## 2024-07-30 ENCOUNTER — Other Ambulatory Visit (HOSPITAL_COMMUNITY): Payer: Self-pay
